# Patient Record
Sex: Female | Born: 1935 | Race: White | Hispanic: No | Marital: Married | Smoking: Never smoker
Health system: Southern US, Community
[De-identification: ages and names within clinical notes are randomized; demographics above are authoritative.]

## PROBLEM LIST (undated history)

## (undated) DIAGNOSIS — F419 Anxiety disorder, unspecified: Secondary | ICD-10-CM

## (undated) DIAGNOSIS — R030 Elevated blood-pressure reading, without diagnosis of hypertension: Secondary | ICD-10-CM

## (undated) DIAGNOSIS — H269 Unspecified cataract: Secondary | ICD-10-CM

## (undated) DIAGNOSIS — M1990 Unspecified osteoarthritis, unspecified site: Secondary | ICD-10-CM

## (undated) HISTORY — DX: Unspecified osteoarthritis, unspecified site: M19.90

## (undated) HISTORY — PX: OTHER SURGICAL HISTORY: SHX169

## (undated) HISTORY — DX: Unspecified cataract: H26.9

## (undated) HISTORY — DX: Anxiety disorder, unspecified: F41.9

## (undated) HISTORY — DX: Elevated blood-pressure reading, without diagnosis of hypertension: R03.0

---

## 1998-04-14 ENCOUNTER — Other Ambulatory Visit: Admission: RE | Admit: 1998-04-14 | Discharge: 1998-04-14 | Payer: Self-pay | Admitting: Obstetrics and Gynecology

## 1999-04-12 ENCOUNTER — Other Ambulatory Visit: Admission: RE | Admit: 1999-04-12 | Discharge: 1999-04-12 | Payer: Self-pay | Admitting: Obstetrics and Gynecology

## 1999-06-23 ENCOUNTER — Other Ambulatory Visit: Admission: RE | Admit: 1999-06-23 | Discharge: 1999-06-23 | Payer: Self-pay | Admitting: Gastroenterology

## 1999-06-23 ENCOUNTER — Encounter (INDEPENDENT_AMBULATORY_CARE_PROVIDER_SITE_OTHER): Payer: Self-pay | Admitting: Specialist

## 1999-08-03 ENCOUNTER — Encounter: Payer: Self-pay | Admitting: Cardiology

## 1999-08-03 ENCOUNTER — Inpatient Hospital Stay (HOSPITAL_COMMUNITY): Admission: EM | Admit: 1999-08-03 | Discharge: 1999-08-04 | Payer: Self-pay | Admitting: *Deleted

## 1999-08-04 ENCOUNTER — Encounter: Payer: Self-pay | Admitting: Cardiology

## 2000-04-05 ENCOUNTER — Other Ambulatory Visit: Admission: RE | Admit: 2000-04-05 | Discharge: 2000-04-05 | Payer: Self-pay | Admitting: Obstetrics and Gynecology

## 2001-05-07 ENCOUNTER — Other Ambulatory Visit: Admission: RE | Admit: 2001-05-07 | Discharge: 2001-05-07 | Payer: Self-pay | Admitting: Obstetrics and Gynecology

## 2002-05-22 ENCOUNTER — Other Ambulatory Visit: Admission: RE | Admit: 2002-05-22 | Discharge: 2002-05-22 | Payer: Self-pay | Admitting: Obstetrics and Gynecology

## 2004-03-25 ENCOUNTER — Ambulatory Visit (HOSPITAL_COMMUNITY): Admission: RE | Admit: 2004-03-25 | Discharge: 2004-03-25 | Payer: Self-pay | Admitting: Gastroenterology

## 2004-03-25 ENCOUNTER — Encounter (INDEPENDENT_AMBULATORY_CARE_PROVIDER_SITE_OTHER): Payer: Self-pay | Admitting: Specialist

## 2005-05-30 ENCOUNTER — Encounter: Admission: RE | Admit: 2005-05-30 | Discharge: 2005-05-30 | Payer: Self-pay | Admitting: Internal Medicine

## 2006-08-31 IMAGING — CR DG HAND COMPLETE 3+V*R*
4 series · 4 of 4 positions shown · non-contrast
Comparison: none

CLINICAL DATA: Slammed thumb in car door. 
 RIGHT HAND ? 3 VIEW:

[x hand pa right]
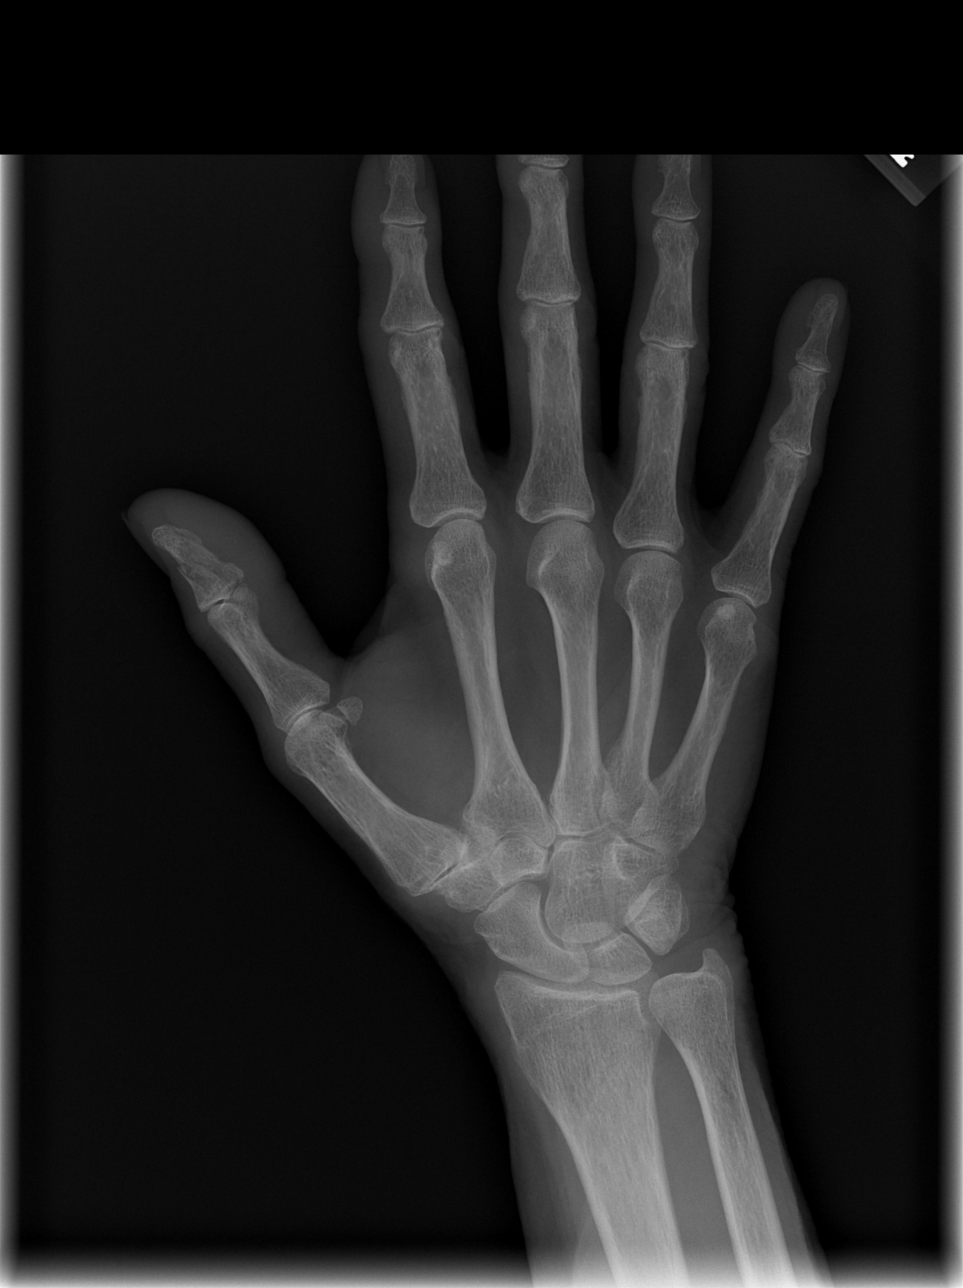

[x hand oblique right]
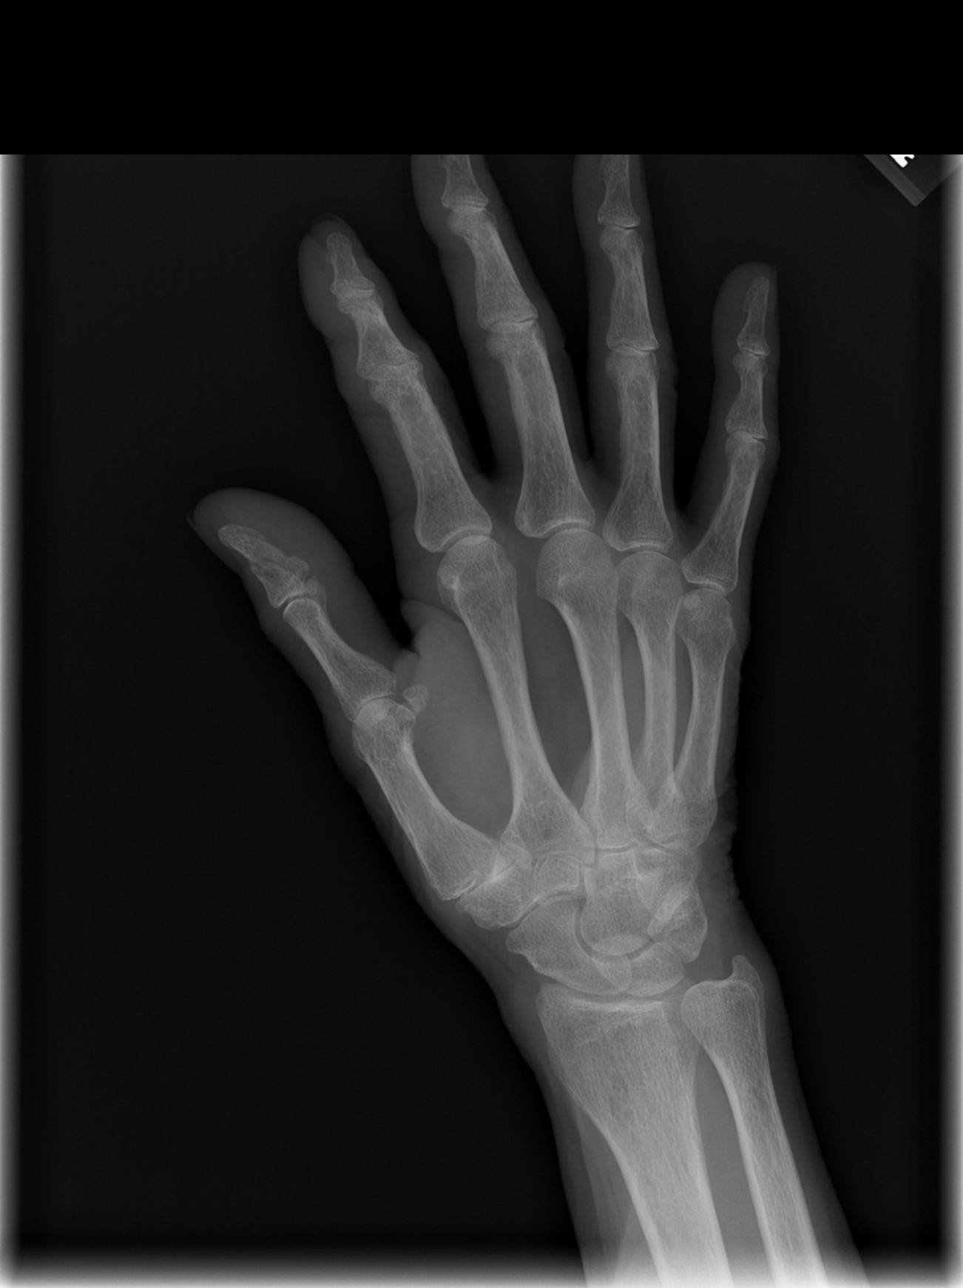

[x hand lat right (1 of 2)]
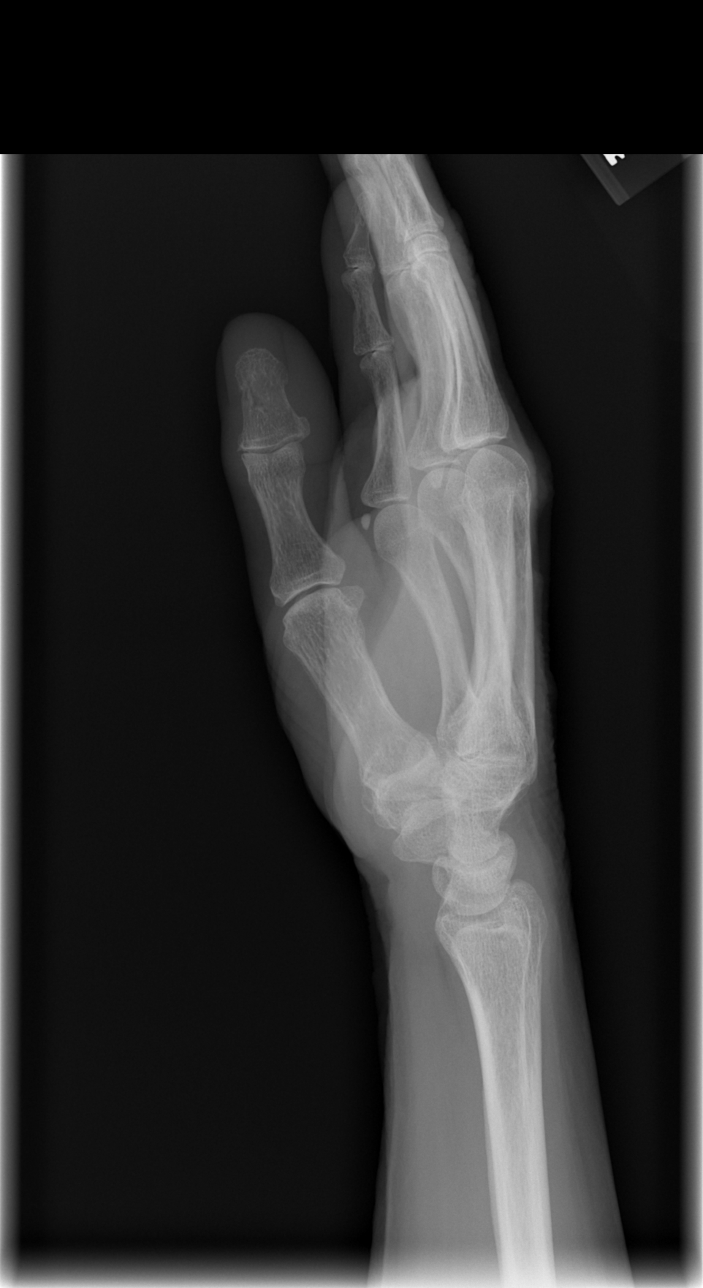

[x hand lat right (2 of 2)]
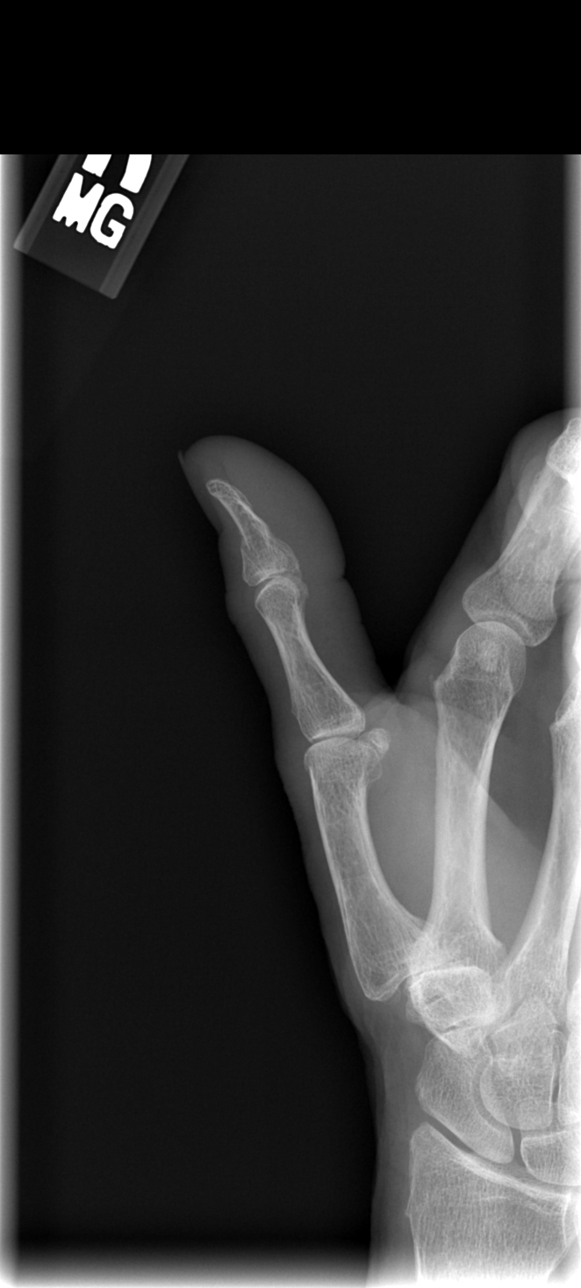

[4 of 4 positions shown; findings below may reference images not displayed]

FINDINGS: Three views of the right hand and a lateral view of the right thumb show a comminuted fracture of the distal phalanx of the right thumb with soft tissue swelling.  The fracture does appear to involve the DIP joint, with no displacement evident.  No other acute bony abnormality is seen.  There is degenerative change at the carpal-first metacarpal articulation.
IMPRESSION: Nondisplaced comminuted fracture of the distal phalanx of the right thumb.

## 2009-08-13 ENCOUNTER — Encounter: Admission: RE | Admit: 2009-08-13 | Discharge: 2009-08-13 | Payer: Self-pay | Admitting: Obstetrics and Gynecology

## 2010-11-14 IMAGING — CR DG THORACIC SPINE 3V
3 series · 3 of 3 positions shown · non-contrast
Comparison: None

CLINICAL DATA: Osteoporosis

THORACIC SPINE - 2 VIEW + SWIMMERS

[t t-spine a.p.]
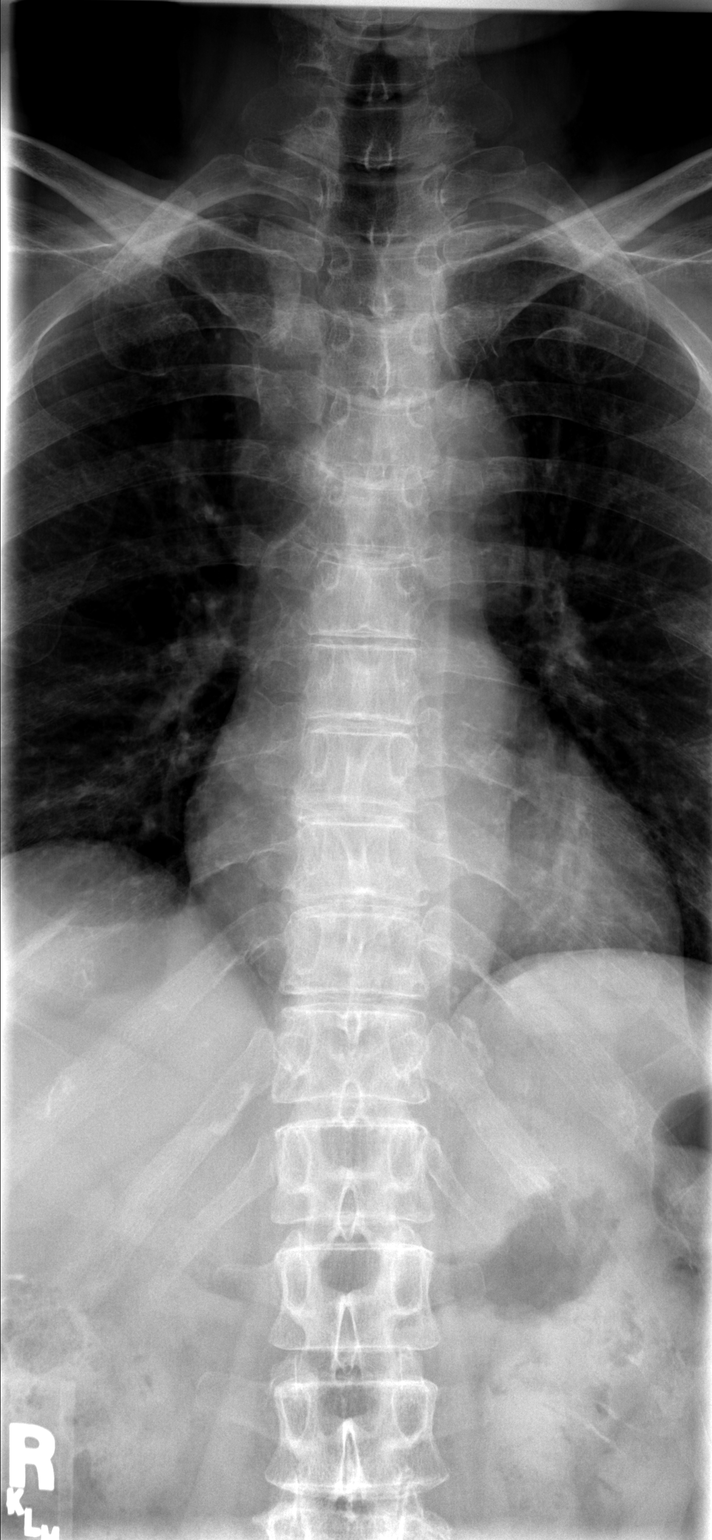

[t t-spine lat *]
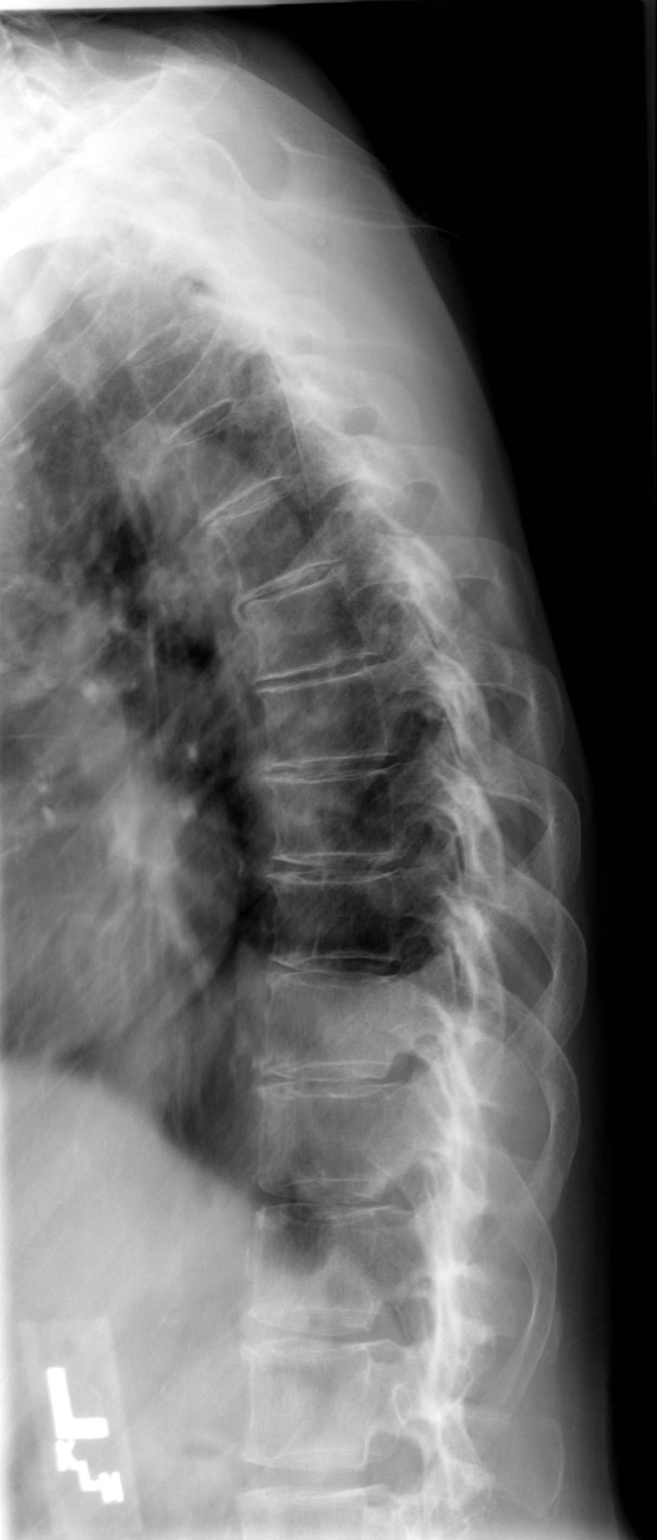

[t swimmers]
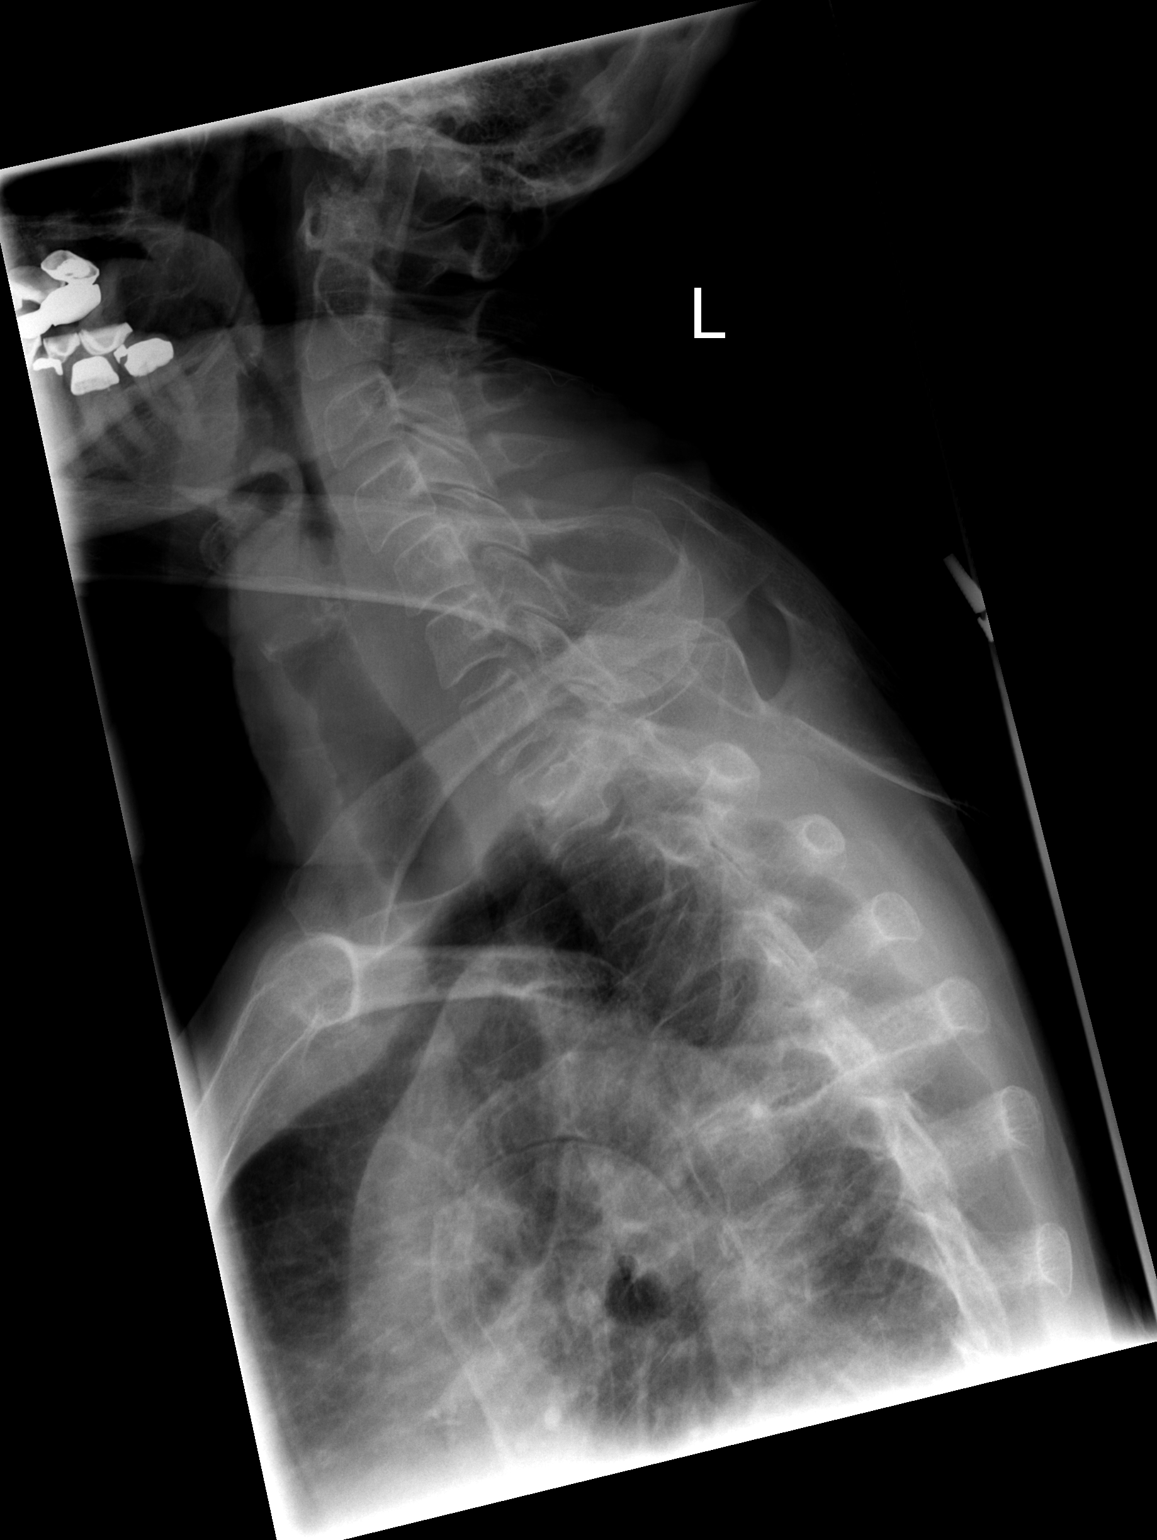

[3 of 3 positions shown; findings below may reference images not displayed]

FINDINGS: The thoracic vertebrae are in normal alignment.  The
bones do appear to be diffusely osteopenic.  No compression
deformity is seen.  Only mild degenerative change is noted.
IMPRESSION: Normal alignment with diffuse osteopenia.  No acute abnormality.

## 2011-01-28 NOTE — Op Note (Signed)
Erica Orr, Erica Orr                        ACCOUNT NO.:  [id number]   MEDICAL RECORD NO.:  [id number]                   PATIENT TYPE:  AMB   LOCATION:  ENDO                                 FACILITY:  [facility]   PHYSICIAN:  Petra Kuba, M.D.                 DATE OF BIRTH:  [birth year]   DATE OF PROCEDURE:  03/25/2004  DATE OF DISCHARGE:                                 OPERATIVE REPORT   PROCEDURE:  Esophagogastroduodenoscopy with biopsy.   INDICATIONS:  Chronic reflux, questionable history of Barrett's on previous  endoscopy.   Consent was signed after risks, benefits, methods, options thoroughly  discussed in the office.   Additional medicines for this procedure were Demerol 10 and Versed 1.   PROCEDURE:  The video endoscope was inserted by direct vision.  The  proximal, mid, and distal esophagus was normal.  There were no signs of any  Barrett's.  She did have a small hiatal hernia and some GE junction spasm.  The scope was passed into the stomach, where a few mid gastric erosions were  seen, and advanced through the antrum, pertinent for some minimal antritis,  through a normal pylorus, into a normal duodenal bulb, and around the C loop  to a normal second portion of the duodenum.  The scope was withdrawn back  into the bulb, and a good look there ruled out abnormalities in all  locations.  The scope was withdrawn back to the stomach and retroflexed.  High in the cardia, a hiatal hernia was confirmed.  The fundus, angularis,  lesser and greater curve were all normal except for the erosion seen on  insertion.  The scope was straightened, and straight visualization of the  stomach did not reveal any additional findings.  We took a few biopsies of  the erosions and put them in one container and a few biopsies of the distal  esophagus to make sure there were no microscopic changes compatible with the  Barrett's and put them in a second container.  No other abnormalities were  seen  as the scope was removed.  The patient tolerated the procedure well.  There was no obvious immediate complication.   ENDOSCOPIC DIAGNOSES:  1. Small hiatal hernia.  2. A few mid greater curve erosions, probably aspirin-induced, status post     biopsy.  3. Minimal antritis.  4. Otherwise normal esophagogastroduodenoscopy, status post distal     esophageal biopsies to rule out Barrett's.   PLAN:  Would use pump inhibitors p.r.n. and then long term if she ever has  an aspirin-induced GI bleed or complication.  Be happy to see back p.r.n.,  otherwise return care to Dr. Jarold Motto for further customary health-care  maintenance.  I will await pathology and be glad to help as needed.  Petra Kuba, M.D.    MEM/MEDQ  D:  03/25/2004  T:  03/25/2004  Job:  [id number]   cc:   Barry Dienes. Eloise Harman, M.D.  [address]  [place]  [place] [address]  Fax: [phone]

## 2011-01-28 NOTE — Op Note (Signed)
Erica Orr, Erica Orr                        ACCOUNT NO.:  [id number]   MEDICAL RECORD NO.:  [id number]                   PATIENT TYPE:  AMB   LOCATION:  ENDO                                 [facility]ACILITY:  [facility]   PHYSICIAN:  Petra Kuba, M.D.                 DATE OF BIRTH:  1930-07-20   DATE OF PROCEDURE:  03/25/2004  DATE OF DISCHARGE:                                 OPERATIVE REPORT   PROCEDURE:  Colonoscopy.   INDICATION:  Family history and personal history of colon polyps, due for  repeat screening.  Consent was signed after risks, benefits, methods,  options thoroughly discussed in the office.   MEDICINES USED:  1. Demerol 50.  2. Versed 5.   PROCEDURE:  Rectal inspection is pertinent for external hemorrhoids.  Small  digital exam is negative.  Video pediatric adjustable colonoscope was  inserted with some difficulty to a tortuous sigmoid.  Once past this area we  were easily able to be advanced around the colon to the cecum.  This did  require some abdominal pressure but no position changes.  No obvious  abnormality was seen on insertion.  The cecum was identified by the  appendiceal orifice and the ileocecal valve.  Prep was adequate.  There was  some liquid stool that required washing and suctioning.  The scope was  slowly withdrawn.  The cecum and the ascending were normal. In the proximal  level of the hepatic flexure a tiny polyp was seen and was hot biopsied x1.  The scope was further withdrawn, no other polypoid lesions were seen as we  slowly withdrew back to the rectum.  There was a rare left-sided diverticula  but no other abnormalities.  Anorectal pull through and retroflexion  confirmed some small hemorrhoids.  The scope was straightened and readvanced  a short ways up the left side of the colon.  Air was suctioned, the scope  removed.  The patient tolerated the procedure well.  There was no obvious  immediate complications.   ENDOSCOPIC DIAGNOSES:  1.  Internal, external hemorrhoids.  2. Tortuous sigmoid.  3. Rare left-sided diverticula.  4. Tiny hepatic flexure polyp hot biopsied.  5. Otherwise within normal limits to the cecum.   PLAN:  Await pathology, but probably recheck colon screening in 5 years if  doing well medically.  Might even want to do a virtual colonoscopy based on  tortuosity at that junction if widely available and continue workup with a  one-time EGD.                                               Petra Kuba, M.D.    MEM/MEDQ  D:  03/25/2004  T:  03/25/2004  Job:  [id number]   cc:   Brunilda Payor,  M.D. 

## 2011-01-28 NOTE — Discharge Summary (Signed)
[facility]  Patient:    Erica Orr                      MRN: [id number] Adm. Date:  [id number] Disc. Date: 08/04/99 Attending:  Rollene Rotunda Dictator:   Leonides Cave, P.A. CC:         Rollene Rotunda, M.D. [facility]             Vania Rea. Jarold Motto, M.D. [facility]                  Referring Physician Discharge [facility]  DATE OF BIRTH:  12/23/1929  DISCHARGE DIAGNOSES: 1. Atypical left arm pain, prolonged. 2. Recent hypertension, probably situational. 3. History of medical noncompliance.  BRIEF HISTORY:  The patient is a 77 year old white female who transferred from [facility] secondary to increased blood pressure and arm pain. On Saturday prior to admission she awoke at 7:30 in the morning and noticed that her left upper arm felt heavy which lasted about 30 minutes.  This recurred on Sunday morning and lasted four to five hours.  It waxed and waned and was not helped with heat, cold, muscle cream, or activity.  The patient went to be evaluated at Urgent Care on August 03, 1999, and reportedly her blood pressure was 192/123, pulse  126, and she was given two sublingual nitroglycerin which provided very slight relief.  She was transferred to [facility] Emergency Department from Urgent Care. The patient was admitted to rule out MI and was setup for a Cardiolite.  HOSPITAL COURSE:  The patient had a stress Cardiolite on August 04, 1999. Predicted maximum heart rate was 153, heart rate reach was 150.  The patient tolerated the test very well.  There was no change in her upper extremity pain.  Cardiolite results revealed no ischemia, normal Cardiolite, ejection fraction 73%. The patient is therefore discharged home in stable condition and to follow up with her primary care physician concerning her arm pain.  DISCHARGE MEDICATIONS:  The patient will be sent home on the same medications she came in on.  They include, ______  40 mg q.d. and coated aspirin 325 mg q.d.  ACTIVITY:  Unrestricted.  DIET:  Low fat diet was recommended.  FOLLOWUP:  The patient was told to follow up with Dr. Antoine Poche on a p.r.n. basis and Dr. Jarold Motto as needed.  She is to follow up and/or obtain a primary care physician to manage her blood pressures.  Her blood pressures were a little on he high side during the hospitalization and at some point she may need to be put on antihypertensive medications.  The patients CK, CK-MBs and troponin I was negative x 3.  Total cholesterol was  229, triglycerides 58, HDL 85 and LDL 135. DD:  08/04/99 TD:  08/04/99 Job: [id number]

## 2011-09-27 DIAGNOSIS — H40059 Ocular hypertension, unspecified eye: Secondary | ICD-10-CM | POA: Diagnosis not present

## 2011-10-13 ENCOUNTER — Other Ambulatory Visit: Payer: Self-pay | Admitting: Dermatology

## 2011-10-13 DIAGNOSIS — Z85828 Personal history of other malignant neoplasm of skin: Secondary | ICD-10-CM | POA: Diagnosis not present

## 2011-10-13 DIAGNOSIS — D0439 Carcinoma in situ of skin of other parts of face: Secondary | ICD-10-CM | POA: Diagnosis not present

## 2011-10-13 DIAGNOSIS — D216 Benign neoplasm of connective and other soft tissue of trunk, unspecified: Secondary | ICD-10-CM | POA: Diagnosis not present

## 2011-10-13 DIAGNOSIS — L821 Other seborrheic keratosis: Secondary | ICD-10-CM | POA: Diagnosis not present

## 2011-10-13 DIAGNOSIS — C44320 Squamous cell carcinoma of skin of unspecified parts of face: Secondary | ICD-10-CM | POA: Diagnosis not present

## 2011-10-13 DIAGNOSIS — D235 Other benign neoplasm of skin of trunk: Secondary | ICD-10-CM | POA: Diagnosis not present

## 2012-01-05 DIAGNOSIS — C44320 Squamous cell carcinoma of skin of unspecified parts of face: Secondary | ICD-10-CM | POA: Diagnosis not present

## 2012-01-09 DIAGNOSIS — D280 Benign neoplasm of vulva: Secondary | ICD-10-CM | POA: Diagnosis not present

## 2012-01-09 DIAGNOSIS — D4959 Neoplasm of unspecified behavior of other genitourinary organ: Secondary | ICD-10-CM | POA: Diagnosis not present

## 2012-02-13 DIAGNOSIS — G44209 Tension-type headache, unspecified, not intractable: Secondary | ICD-10-CM | POA: Diagnosis not present

## 2012-02-13 DIAGNOSIS — H52229 Regular astigmatism, unspecified eye: Secondary | ICD-10-CM | POA: Diagnosis not present

## 2012-02-13 DIAGNOSIS — G43009 Migraine without aura, not intractable, without status migrainosus: Secondary | ICD-10-CM | POA: Diagnosis not present

## 2012-02-13 DIAGNOSIS — H5200 Hypermetropia, unspecified eye: Secondary | ICD-10-CM | POA: Diagnosis not present

## 2012-02-16 DIAGNOSIS — L819 Disorder of pigmentation, unspecified: Secondary | ICD-10-CM | POA: Diagnosis not present

## 2012-02-16 DIAGNOSIS — L570 Actinic keratosis: Secondary | ICD-10-CM | POA: Diagnosis not present

## 2012-02-16 DIAGNOSIS — D1801 Hemangioma of skin and subcutaneous tissue: Secondary | ICD-10-CM | POA: Diagnosis not present

## 2012-02-16 DIAGNOSIS — L821 Other seborrheic keratosis: Secondary | ICD-10-CM | POA: Diagnosis not present

## 2012-04-14 ENCOUNTER — Emergency Department (HOSPITAL_COMMUNITY): Payer: Medicare Other

## 2012-04-14 ENCOUNTER — Emergency Department (HOSPITAL_COMMUNITY)
Admission: EM | Admit: 2012-04-14 | Discharge: 2012-04-14 | Disposition: A | Discharge status: Home / Self Care | Payer: Medicare Other | Attending: Emergency Medicine | Admitting: Emergency Medicine

## 2012-04-14 ENCOUNTER — Encounter (HOSPITAL_COMMUNITY): Payer: Self-pay | Admitting: Emergency Medicine

## 2012-04-14 DIAGNOSIS — M25559 Pain in unspecified hip: Secondary | ICD-10-CM | POA: Diagnosis not present

## 2012-04-14 DIAGNOSIS — M5430 Sciatica, unspecified side: Secondary | ICD-10-CM | POA: Insufficient documentation

## 2012-04-14 DIAGNOSIS — M4120 Other idiopathic scoliosis, site unspecified: Secondary | ICD-10-CM | POA: Diagnosis not present

## 2012-04-14 DIAGNOSIS — M549 Dorsalgia, unspecified: Secondary | ICD-10-CM | POA: Diagnosis not present

## 2012-04-14 MED ORDER — IBUPROFEN 200 MG PO TABS
600.0000 mg | ORAL_TABLET | Freq: Once | ORAL | Status: AC
  Administered 2012-04-14: 600 mg via ORAL
  Filled 2012-04-14: qty 3

## 2012-04-14 MED ORDER — IBUPROFEN 600 MG PO TABS: 600.0000 mg | ORAL_TABLET | Freq: Four times a day (QID) | ORAL | Status: AC | PRN

## 2012-04-14 MED ORDER — HYDROCODONE-ACETAMINOPHEN 5-325 MG PO TABS: 2.0000 | ORAL_TABLET | ORAL | Status: AC | PRN

## 2012-04-14 NOTE — ED Notes (Signed)
Pt c/o right hip pain that radiates to back that started 2200 last night in a jacuzzi. Pt reports waking up in severe pain at 0300. Denies fall. No shortening on inspection.

## 2012-04-14 NOTE — ED Provider Notes (Signed)
History     CSN: [id number]  Arrival date & time 04/14/12  0650   First MD Initiated Contact with Patient 04/14/12 [phone]      No chief complaint on file.    HPI Pt c/o right hip pain that radiates to back that started 2200 last night in a jacuzzi. Pt reports waking up in severe pain at 0300. Denies fall. No shortening on inspection.  Patient denies rash.  History reviewed. No pertinent past medical history.  Past Surgical History  Procedure Date  . Knee sx     for torn meniscus    No family history on file.  History  Substance Use Topics  . Smoking status: Never Smoker   . Smokeless tobacco: Never Used  . Alcohol Use: 0.6 oz/week    1 Glasses of wine per week    OB History    Grav Para Term Preterm Abortions TAB SAB Ect Mult Living                  Review of Systems  All other systems reviewed and are negative.    Allergies  Review of patient's allergies indicates no known allergies.  Home Medications   Current Outpatient Rx  Name Route Sig Dispense Refill  . ACETAMINOPHEN 500 MG PO TABS Oral Take 500-1,000 mg by mouth every 6 (six) hours as needed. For pain.    Marland Kitchen CALCIUM CARBONATE 600 MG PO TABS Oral Take 600 mg by mouth daily.    Marland Kitchen VITAMIN D 1000 UNITS PO TABS Oral Take 1,000 Units by mouth daily.    Marland Kitchen GLUCOSAMINE-CHONDROITIN 500-400 MG PO TABS Oral Take 1 tablet by mouth daily.    Marland Kitchen HYDROCODONE-ACETAMINOPHEN 5-325 MG PO TABS Oral Take 2 tablets by mouth every 4 (four) hours as needed for pain. 15 tablet 0  . IBUPROFEN 600 MG PO TABS Oral Take 1 tablet (600 mg total) by mouth every 6 (six) hours as needed for pain. 30 tablet 0    BP 165/80  Pulse 90  Temp 97.5 F (36.4 C) (Oral)  Resp 18  SpO2 96%  Physical Exam  Nursing note and vitals reviewed. Constitutional: She is oriented to person, place, and time. She appears well-developed. No distress.  HENT:  Head: Normocephalic and atraumatic.  Eyes: Pupils are equal, round, and reactive to light.    Neck: Normal range of motion.  Cardiovascular: Normal rate and intact distal pulses.   Pulmonary/Chest: No respiratory distress.  Abdominal: Normal appearance. She exhibits no distension.  Musculoskeletal:       Right hip: She exhibits decreased range of motion. She exhibits no bony tenderness, no swelling, no crepitus and no deformity.       Back:  Neurological: She is alert and oriented to person, place, and time. No cranial nerve deficit.  Skin: Skin is warm and dry. No rash noted.  Psychiatric: She has a normal mood and affect. Her behavior is normal.    ED Course  Procedures (including critical care time)  Labs Reviewed - No data to display Dg Lumbar Spine Complete  04/14/2012  *RADIOLOGY REPORT*  Clinical Data: Fall, right back pain  LUMBAR SPINE - COMPLETE 4+ VIEW  Comparison: 08/13/2009  Findings: Five lumbar-type vertebral bodies.  Mild lumbar levoscoliosis.  No evidence of fracture or dislocation.  The vertebral body heights are maintained.  Mild multilevel degenerative changes, most prominent at L2-3.  Vascular calcifications.  IMPRESSION: No fracture or dislocation is seen.  Mild multilevel degenerative changes with  levoscoliosis.  Original Report Authenticated By: Charline Bills, M.D.   Dg Hip Complete Right  04/14/2012  *RADIOLOGY REPORT*  Clinical Data: Fall, right back/hip pain  RIGHT HIP - COMPLETE 2+ VIEW  Comparison: None.  Findings: No fracture or dislocation is seen.  The bilateral hip joint spaces are symmetric.  The visualized bony pelvis appears intact.  Mild degenerative changes of the lower lumbar spine.  IMPRESSION: No fracture or dislocation is seen.  Original Report Authenticated By: Charline Bills, M.D.     1. Sciatica       MDM         Nelia Shi, MD 04/14/12 [phone]

## 2012-04-17 DIAGNOSIS — M545 Low back pain: Secondary | ICD-10-CM | POA: Diagnosis not present

## 2012-04-24 DIAGNOSIS — M545 Low back pain: Secondary | ICD-10-CM | POA: Diagnosis not present

## 2012-05-07 DIAGNOSIS — M545 Low back pain: Secondary | ICD-10-CM | POA: Diagnosis not present

## 2012-05-15 DIAGNOSIS — E785 Hyperlipidemia, unspecified: Secondary | ICD-10-CM | POA: Diagnosis not present

## 2012-05-15 DIAGNOSIS — R7309 Other abnormal glucose: Secondary | ICD-10-CM | POA: Diagnosis not present

## 2012-05-15 DIAGNOSIS — R82998 Other abnormal findings in urine: Secondary | ICD-10-CM | POA: Diagnosis not present

## 2012-05-18 DIAGNOSIS — M545 Low back pain: Secondary | ICD-10-CM | POA: Diagnosis not present

## 2012-05-21 DIAGNOSIS — Z1212 Encounter for screening for malignant neoplasm of rectum: Secondary | ICD-10-CM | POA: Diagnosis not present

## 2012-05-21 DIAGNOSIS — Z23 Encounter for immunization: Secondary | ICD-10-CM | POA: Diagnosis not present

## 2012-05-21 DIAGNOSIS — E785 Hyperlipidemia, unspecified: Secondary | ICD-10-CM | POA: Diagnosis not present

## 2012-05-21 DIAGNOSIS — Z0000 Encounter for general adult medical examination without abnormal findings: Secondary | ICD-10-CM | POA: Diagnosis not present

## 2012-05-21 DIAGNOSIS — R030 Elevated blood-pressure reading, without diagnosis of hypertension: Secondary | ICD-10-CM | POA: Diagnosis not present

## 2012-05-21 DIAGNOSIS — K449 Diaphragmatic hernia without obstruction or gangrene: Secondary | ICD-10-CM | POA: Diagnosis not present

## 2012-08-14 DIAGNOSIS — Z1231 Encounter for screening mammogram for malignant neoplasm of breast: Secondary | ICD-10-CM | POA: Diagnosis not present

## 2012-08-30 DIAGNOSIS — R030 Elevated blood-pressure reading, without diagnosis of hypertension: Secondary | ICD-10-CM | POA: Diagnosis not present

## 2012-08-30 DIAGNOSIS — R05 Cough: Secondary | ICD-10-CM | POA: Diagnosis not present

## 2012-08-30 DIAGNOSIS — R509 Fever, unspecified: Secondary | ICD-10-CM | POA: Diagnosis not present

## 2012-09-26 DIAGNOSIS — H40059 Ocular hypertension, unspecified eye: Secondary | ICD-10-CM | POA: Diagnosis not present

## 2012-10-23 DIAGNOSIS — H409 Unspecified glaucoma: Secondary | ICD-10-CM | POA: Diagnosis not present

## 2012-10-23 DIAGNOSIS — H4011X0 Primary open-angle glaucoma, stage unspecified: Secondary | ICD-10-CM | POA: Diagnosis not present

## 2013-02-14 DIAGNOSIS — L821 Other seborrheic keratosis: Secondary | ICD-10-CM | POA: Diagnosis not present

## 2013-02-14 DIAGNOSIS — L919 Hypertrophic disorder of the skin, unspecified: Secondary | ICD-10-CM | POA: Diagnosis not present

## 2013-02-14 DIAGNOSIS — D239 Other benign neoplasm of skin, unspecified: Secondary | ICD-10-CM | POA: Diagnosis not present

## 2013-02-14 DIAGNOSIS — D1801 Hemangioma of skin and subcutaneous tissue: Secondary | ICD-10-CM | POA: Diagnosis not present

## 2013-02-14 DIAGNOSIS — L909 Atrophic disorder of skin, unspecified: Secondary | ICD-10-CM | POA: Diagnosis not present

## 2013-02-14 DIAGNOSIS — L820 Inflamed seborrheic keratosis: Secondary | ICD-10-CM | POA: Diagnosis not present

## 2013-02-14 DIAGNOSIS — L819 Disorder of pigmentation, unspecified: Secondary | ICD-10-CM | POA: Diagnosis not present

## 2013-04-23 DIAGNOSIS — H409 Unspecified glaucoma: Secondary | ICD-10-CM | POA: Diagnosis not present

## 2013-04-23 DIAGNOSIS — H4011X0 Primary open-angle glaucoma, stage unspecified: Secondary | ICD-10-CM | POA: Diagnosis not present

## 2013-05-29 DIAGNOSIS — Z1289 Encounter for screening for malignant neoplasm of other sites: Secondary | ICD-10-CM | POA: Diagnosis not present

## 2013-07-12 DIAGNOSIS — R030 Elevated blood-pressure reading, without diagnosis of hypertension: Secondary | ICD-10-CM | POA: Diagnosis not present

## 2013-07-12 DIAGNOSIS — E785 Hyperlipidemia, unspecified: Secondary | ICD-10-CM | POA: Diagnosis not present

## 2013-07-12 DIAGNOSIS — R7309 Other abnormal glucose: Secondary | ICD-10-CM | POA: Diagnosis not present

## 2013-07-12 DIAGNOSIS — R809 Proteinuria, unspecified: Secondary | ICD-10-CM | POA: Diagnosis not present

## 2013-07-22 DIAGNOSIS — R030 Elevated blood-pressure reading, without diagnosis of hypertension: Secondary | ICD-10-CM | POA: Diagnosis not present

## 2013-07-22 DIAGNOSIS — R7301 Impaired fasting glucose: Secondary | ICD-10-CM | POA: Diagnosis not present

## 2013-07-22 DIAGNOSIS — Z0000 Encounter for general adult medical examination without abnormal findings: Secondary | ICD-10-CM | POA: Diagnosis not present

## 2013-07-22 DIAGNOSIS — Z1331 Encounter for screening for depression: Secondary | ICD-10-CM | POA: Diagnosis not present

## 2013-07-22 DIAGNOSIS — IMO0002 Reserved for concepts with insufficient information to code with codable children: Secondary | ICD-10-CM | POA: Diagnosis not present

## 2013-07-22 DIAGNOSIS — E785 Hyperlipidemia, unspecified: Secondary | ICD-10-CM | POA: Diagnosis not present

## 2013-07-22 DIAGNOSIS — Z23 Encounter for immunization: Secondary | ICD-10-CM | POA: Diagnosis not present

## 2013-07-22 DIAGNOSIS — K449 Diaphragmatic hernia without obstruction or gangrene: Secondary | ICD-10-CM | POA: Diagnosis not present

## 2013-07-25 DIAGNOSIS — Z1212 Encounter for screening for malignant neoplasm of rectum: Secondary | ICD-10-CM | POA: Diagnosis not present

## 2013-07-30 DIAGNOSIS — R350 Frequency of micturition: Secondary | ICD-10-CM | POA: Diagnosis not present

## 2013-07-30 DIAGNOSIS — R82998 Other abnormal findings in urine: Secondary | ICD-10-CM | POA: Diagnosis not present

## 2013-07-30 DIAGNOSIS — IMO0002 Reserved for concepts with insufficient information to code with codable children: Secondary | ICD-10-CM | POA: Diagnosis not present

## 2013-07-30 DIAGNOSIS — N3000 Acute cystitis without hematuria: Secondary | ICD-10-CM | POA: Diagnosis not present

## 2013-08-16 DIAGNOSIS — Z1231 Encounter for screening mammogram for malignant neoplasm of breast: Secondary | ICD-10-CM | POA: Diagnosis not present

## 2013-10-22 DIAGNOSIS — H5200 Hypermetropia, unspecified eye: Secondary | ICD-10-CM | POA: Diagnosis not present

## 2013-10-22 DIAGNOSIS — H524 Presbyopia: Secondary | ICD-10-CM | POA: Diagnosis not present

## 2013-10-22 DIAGNOSIS — H52229 Regular astigmatism, unspecified eye: Secondary | ICD-10-CM | POA: Diagnosis not present

## 2013-10-22 DIAGNOSIS — H269 Unspecified cataract: Secondary | ICD-10-CM | POA: Diagnosis not present

## 2014-03-12 DIAGNOSIS — D1801 Hemangioma of skin and subcutaneous tissue: Secondary | ICD-10-CM | POA: Diagnosis not present

## 2014-03-12 DIAGNOSIS — D2370 Other benign neoplasm of skin of unspecified lower limb, including hip: Secondary | ICD-10-CM | POA: Diagnosis not present

## 2014-03-12 DIAGNOSIS — D239 Other benign neoplasm of skin, unspecified: Secondary | ICD-10-CM | POA: Diagnosis not present

## 2014-03-12 DIAGNOSIS — L821 Other seborrheic keratosis: Secondary | ICD-10-CM | POA: Diagnosis not present

## 2014-03-12 DIAGNOSIS — L723 Sebaceous cyst: Secondary | ICD-10-CM | POA: Diagnosis not present

## 2014-03-12 DIAGNOSIS — L819 Disorder of pigmentation, unspecified: Secondary | ICD-10-CM | POA: Diagnosis not present

## 2014-03-12 DIAGNOSIS — L739 Follicular disorder, unspecified: Secondary | ICD-10-CM | POA: Diagnosis not present

## 2014-03-12 DIAGNOSIS — L570 Actinic keratosis: Secondary | ICD-10-CM | POA: Diagnosis not present

## 2014-03-18 DIAGNOSIS — H18419 Arcus senilis, unspecified eye: Secondary | ICD-10-CM | POA: Diagnosis not present

## 2014-03-18 DIAGNOSIS — H25019 Cortical age-related cataract, unspecified eye: Secondary | ICD-10-CM | POA: Diagnosis not present

## 2014-03-18 DIAGNOSIS — H2510 Age-related nuclear cataract, unspecified eye: Secondary | ICD-10-CM | POA: Diagnosis not present

## 2014-03-18 DIAGNOSIS — H02839 Dermatochalasis of unspecified eye, unspecified eyelid: Secondary | ICD-10-CM | POA: Diagnosis not present

## 2014-04-07 DIAGNOSIS — H2510 Age-related nuclear cataract, unspecified eye: Secondary | ICD-10-CM | POA: Diagnosis not present

## 2014-04-07 DIAGNOSIS — H269 Unspecified cataract: Secondary | ICD-10-CM | POA: Diagnosis not present

## 2014-04-08 DIAGNOSIS — H2510 Age-related nuclear cataract, unspecified eye: Secondary | ICD-10-CM | POA: Diagnosis not present

## 2014-04-25 DIAGNOSIS — H2510 Age-related nuclear cataract, unspecified eye: Secondary | ICD-10-CM | POA: Diagnosis not present

## 2014-04-25 DIAGNOSIS — H269 Unspecified cataract: Secondary | ICD-10-CM | POA: Diagnosis not present

## 2014-07-28 DIAGNOSIS — R030 Elevated blood-pressure reading, without diagnosis of hypertension: Secondary | ICD-10-CM | POA: Diagnosis not present

## 2014-07-28 DIAGNOSIS — R7301 Impaired fasting glucose: Secondary | ICD-10-CM | POA: Diagnosis not present

## 2014-07-28 DIAGNOSIS — E785 Hyperlipidemia, unspecified: Secondary | ICD-10-CM | POA: Diagnosis not present

## 2014-07-28 DIAGNOSIS — R739 Hyperglycemia, unspecified: Secondary | ICD-10-CM | POA: Diagnosis not present

## 2014-07-28 DIAGNOSIS — Z008 Encounter for other general examination: Secondary | ICD-10-CM | POA: Diagnosis not present

## 2014-08-04 ENCOUNTER — Other Ambulatory Visit: Payer: Self-pay | Admitting: Internal Medicine

## 2014-08-04 DIAGNOSIS — Z23 Encounter for immunization: Secondary | ICD-10-CM | POA: Diagnosis not present

## 2014-08-04 DIAGNOSIS — R1310 Dysphagia, unspecified: Secondary | ICD-10-CM | POA: Diagnosis not present

## 2014-08-04 DIAGNOSIS — R030 Elevated blood-pressure reading, without diagnosis of hypertension: Secondary | ICD-10-CM | POA: Diagnosis not present

## 2014-08-04 DIAGNOSIS — R739 Hyperglycemia, unspecified: Secondary | ICD-10-CM | POA: Diagnosis not present

## 2014-08-04 DIAGNOSIS — Z0000 Encounter for general adult medical examination without abnormal findings: Secondary | ICD-10-CM | POA: Diagnosis not present

## 2014-08-04 DIAGNOSIS — F419 Anxiety disorder, unspecified: Secondary | ICD-10-CM | POA: Diagnosis not present

## 2014-08-04 DIAGNOSIS — R7301 Impaired fasting glucose: Secondary | ICD-10-CM | POA: Diagnosis not present

## 2014-08-04 DIAGNOSIS — E785 Hyperlipidemia, unspecified: Secondary | ICD-10-CM | POA: Diagnosis not present

## 2014-08-04 DIAGNOSIS — R4702 Dysphasia: Secondary | ICD-10-CM

## 2014-08-04 DIAGNOSIS — Z1389 Encounter for screening for other disorder: Secondary | ICD-10-CM | POA: Diagnosis not present

## 2014-08-04 DIAGNOSIS — R8299 Other abnormal findings in urine: Secondary | ICD-10-CM | POA: Diagnosis not present

## 2014-08-05 ENCOUNTER — Ambulatory Visit
Admission: RE | Admit: 2014-08-05 | Discharge: 2014-08-05 | Disposition: A | Discharge status: Home / Self Care | Payer: Medicare Other | Source: Ambulatory Visit | Attending: Internal Medicine | Admitting: Internal Medicine

## 2014-08-05 DIAGNOSIS — K449 Diaphragmatic hernia without obstruction or gangrene: Secondary | ICD-10-CM | POA: Diagnosis not present

## 2014-08-05 DIAGNOSIS — K219 Gastro-esophageal reflux disease without esophagitis: Secondary | ICD-10-CM | POA: Diagnosis not present

## 2014-08-05 DIAGNOSIS — R4702 Dysphasia: Secondary | ICD-10-CM

## 2014-08-17 DIAGNOSIS — N390 Urinary tract infection, site not specified: Secondary | ICD-10-CM | POA: Diagnosis not present

## 2014-08-17 DIAGNOSIS — R5383 Other fatigue: Secondary | ICD-10-CM | POA: Diagnosis not present

## 2014-08-17 DIAGNOSIS — R300 Dysuria: Secondary | ICD-10-CM | POA: Diagnosis not present

## 2014-08-22 DIAGNOSIS — Z1212 Encounter for screening for malignant neoplasm of rectum: Secondary | ICD-10-CM | POA: Diagnosis not present

## 2014-08-23 DIAGNOSIS — N3090 Cystitis, unspecified without hematuria: Secondary | ICD-10-CM | POA: Diagnosis not present

## 2014-10-13 DIAGNOSIS — Z1231 Encounter for screening mammogram for malignant neoplasm of breast: Secondary | ICD-10-CM | POA: Diagnosis not present

## 2014-10-16 DIAGNOSIS — R922 Inconclusive mammogram: Secondary | ICD-10-CM | POA: Diagnosis not present

## 2014-10-16 DIAGNOSIS — R928 Other abnormal and inconclusive findings on diagnostic imaging of breast: Secondary | ICD-10-CM | POA: Diagnosis not present

## 2014-11-24 DIAGNOSIS — K649 Unspecified hemorrhoids: Secondary | ICD-10-CM | POA: Diagnosis not present

## 2015-03-13 DIAGNOSIS — L570 Actinic keratosis: Secondary | ICD-10-CM | POA: Diagnosis not present

## 2015-03-13 DIAGNOSIS — L720 Epidermal cyst: Secondary | ICD-10-CM | POA: Diagnosis not present

## 2015-03-13 DIAGNOSIS — D225 Melanocytic nevi of trunk: Secondary | ICD-10-CM | POA: Diagnosis not present

## 2015-03-13 DIAGNOSIS — L738 Other specified follicular disorders: Secondary | ICD-10-CM | POA: Diagnosis not present

## 2015-03-13 DIAGNOSIS — D2272 Melanocytic nevi of left lower limb, including hip: Secondary | ICD-10-CM | POA: Diagnosis not present

## 2015-03-13 DIAGNOSIS — L821 Other seborrheic keratosis: Secondary | ICD-10-CM | POA: Diagnosis not present

## 2015-03-13 DIAGNOSIS — L723 Sebaceous cyst: Secondary | ICD-10-CM | POA: Diagnosis not present

## 2015-03-13 DIAGNOSIS — B078 Other viral warts: Secondary | ICD-10-CM | POA: Diagnosis not present

## 2015-03-13 DIAGNOSIS — L814 Other melanin hyperpigmentation: Secondary | ICD-10-CM | POA: Diagnosis not present

## 2015-03-13 DIAGNOSIS — D2271 Melanocytic nevi of right lower limb, including hip: Secondary | ICD-10-CM | POA: Diagnosis not present

## 2015-03-13 DIAGNOSIS — D1801 Hemangioma of skin and subcutaneous tissue: Secondary | ICD-10-CM | POA: Diagnosis not present

## 2015-04-28 DIAGNOSIS — H5203 Hypermetropia, bilateral: Secondary | ICD-10-CM | POA: Diagnosis not present

## 2015-04-28 DIAGNOSIS — H26499 Other secondary cataract, unspecified eye: Secondary | ICD-10-CM | POA: Diagnosis not present

## 2015-04-28 DIAGNOSIS — H52223 Regular astigmatism, bilateral: Secondary | ICD-10-CM | POA: Diagnosis not present

## 2015-04-28 DIAGNOSIS — Z961 Presence of intraocular lens: Secondary | ICD-10-CM | POA: Diagnosis not present

## 2015-04-29 DIAGNOSIS — E559 Vitamin D deficiency, unspecified: Secondary | ICD-10-CM | POA: Diagnosis not present

## 2015-04-29 DIAGNOSIS — N63 Unspecified lump in breast: Secondary | ICD-10-CM | POA: Diagnosis not present

## 2015-08-05 DIAGNOSIS — R7301 Impaired fasting glucose: Secondary | ICD-10-CM | POA: Diagnosis not present

## 2015-08-05 DIAGNOSIS — N390 Urinary tract infection, site not specified: Secondary | ICD-10-CM | POA: Diagnosis not present

## 2015-08-05 DIAGNOSIS — E785 Hyperlipidemia, unspecified: Secondary | ICD-10-CM | POA: Diagnosis not present

## 2015-08-05 DIAGNOSIS — R8299 Other abnormal findings in urine: Secondary | ICD-10-CM | POA: Diagnosis not present

## 2015-08-12 DIAGNOSIS — R1310 Dysphagia, unspecified: Secondary | ICD-10-CM | POA: Diagnosis not present

## 2015-08-12 DIAGNOSIS — Z1389 Encounter for screening for other disorder: Secondary | ICD-10-CM | POA: Diagnosis not present

## 2015-08-12 DIAGNOSIS — K580 Irritable bowel syndrome with diarrhea: Secondary | ICD-10-CM | POA: Diagnosis not present

## 2015-08-12 DIAGNOSIS — M810 Age-related osteoporosis without current pathological fracture: Secondary | ICD-10-CM | POA: Diagnosis not present

## 2015-08-12 DIAGNOSIS — Z6824 Body mass index (BMI) 24.0-24.9, adult: Secondary | ICD-10-CM | POA: Diagnosis not present

## 2015-08-12 DIAGNOSIS — Z0000 Encounter for general adult medical examination without abnormal findings: Secondary | ICD-10-CM | POA: Diagnosis not present

## 2015-08-12 DIAGNOSIS — R7301 Impaired fasting glucose: Secondary | ICD-10-CM | POA: Diagnosis not present

## 2015-08-12 DIAGNOSIS — E785 Hyperlipidemia, unspecified: Secondary | ICD-10-CM | POA: Diagnosis not present

## 2015-08-12 DIAGNOSIS — R030 Elevated blood-pressure reading, without diagnosis of hypertension: Secondary | ICD-10-CM | POA: Diagnosis not present

## 2015-08-12 DIAGNOSIS — Z23 Encounter for immunization: Secondary | ICD-10-CM | POA: Diagnosis not present

## 2015-08-14 DIAGNOSIS — L723 Sebaceous cyst: Secondary | ICD-10-CM | POA: Diagnosis not present

## 2015-08-14 DIAGNOSIS — L720 Epidermal cyst: Secondary | ICD-10-CM | POA: Diagnosis not present

## 2015-08-20 ENCOUNTER — Encounter (HOSPITAL_COMMUNITY): Payer: Medicare Other

## 2015-09-22 DIAGNOSIS — M79674 Pain in right toe(s): Secondary | ICD-10-CM | POA: Diagnosis not present

## 2015-09-22 DIAGNOSIS — M722 Plantar fascial fibromatosis: Secondary | ICD-10-CM | POA: Diagnosis not present

## 2015-10-20 DIAGNOSIS — M79674 Pain in right toe(s): Secondary | ICD-10-CM | POA: Diagnosis not present

## 2015-10-20 DIAGNOSIS — L03031 Cellulitis of right toe: Secondary | ICD-10-CM | POA: Diagnosis not present

## 2015-10-27 DIAGNOSIS — M722 Plantar fascial fibromatosis: Secondary | ICD-10-CM | POA: Diagnosis not present

## 2015-10-27 DIAGNOSIS — M65872 Other synovitis and tenosynovitis, left ankle and foot: Secondary | ICD-10-CM | POA: Diagnosis not present

## 2015-10-27 DIAGNOSIS — M792 Neuralgia and neuritis, unspecified: Secondary | ICD-10-CM | POA: Diagnosis not present

## 2015-11-04 DIAGNOSIS — N63 Unspecified lump in breast: Secondary | ICD-10-CM | POA: Diagnosis not present

## 2015-11-10 DIAGNOSIS — M722 Plantar fascial fibromatosis: Secondary | ICD-10-CM | POA: Diagnosis not present

## 2015-12-09 DIAGNOSIS — R8299 Other abnormal findings in urine: Secondary | ICD-10-CM | POA: Diagnosis not present

## 2015-12-09 DIAGNOSIS — N390 Urinary tract infection, site not specified: Secondary | ICD-10-CM | POA: Diagnosis not present

## 2015-12-10 DIAGNOSIS — R0781 Pleurodynia: Secondary | ICD-10-CM | POA: Diagnosis not present

## 2015-12-10 DIAGNOSIS — W19XXXA Unspecified fall, initial encounter: Secondary | ICD-10-CM | POA: Diagnosis not present

## 2015-12-10 DIAGNOSIS — M5441 Lumbago with sciatica, right side: Secondary | ICD-10-CM | POA: Diagnosis not present

## 2015-12-10 DIAGNOSIS — M79604 Pain in right leg: Secondary | ICD-10-CM | POA: Diagnosis not present

## 2016-02-22 DIAGNOSIS — R8299 Other abnormal findings in urine: Secondary | ICD-10-CM | POA: Diagnosis not present

## 2016-02-22 DIAGNOSIS — N390 Urinary tract infection, site not specified: Secondary | ICD-10-CM | POA: Diagnosis not present

## 2016-03-21 DIAGNOSIS — I739 Peripheral vascular disease, unspecified: Secondary | ICD-10-CM | POA: Diagnosis not present

## 2016-03-21 DIAGNOSIS — M722 Plantar fascial fibromatosis: Secondary | ICD-10-CM | POA: Diagnosis not present

## 2016-03-21 DIAGNOSIS — L603 Nail dystrophy: Secondary | ICD-10-CM | POA: Diagnosis not present

## 2016-03-21 DIAGNOSIS — M71572 Other bursitis, not elsewhere classified, left ankle and foot: Secondary | ICD-10-CM | POA: Diagnosis not present

## 2016-03-25 DIAGNOSIS — Z1289 Encounter for screening for malignant neoplasm of other sites: Secondary | ICD-10-CM | POA: Diagnosis not present

## 2016-04-20 DIAGNOSIS — L03032 Cellulitis of left toe: Secondary | ICD-10-CM | POA: Diagnosis not present

## 2016-05-02 DIAGNOSIS — H5211 Myopia, right eye: Secondary | ICD-10-CM | POA: Diagnosis not present

## 2016-05-02 DIAGNOSIS — H04123 Dry eye syndrome of bilateral lacrimal glands: Secondary | ICD-10-CM | POA: Diagnosis not present

## 2016-05-02 DIAGNOSIS — H52223 Regular astigmatism, bilateral: Secondary | ICD-10-CM | POA: Diagnosis not present

## 2016-05-02 DIAGNOSIS — H5202 Hypermetropia, left eye: Secondary | ICD-10-CM | POA: Diagnosis not present

## 2016-05-12 DIAGNOSIS — D225 Melanocytic nevi of trunk: Secondary | ICD-10-CM | POA: Diagnosis not present

## 2016-05-12 DIAGNOSIS — D1801 Hemangioma of skin and subcutaneous tissue: Secondary | ICD-10-CM | POA: Diagnosis not present

## 2016-05-12 DIAGNOSIS — L603 Nail dystrophy: Secondary | ICD-10-CM | POA: Diagnosis not present

## 2016-05-12 DIAGNOSIS — D2272 Melanocytic nevi of left lower limb, including hip: Secondary | ICD-10-CM | POA: Diagnosis not present

## 2016-05-12 DIAGNOSIS — L738 Other specified follicular disorders: Secondary | ICD-10-CM | POA: Diagnosis not present

## 2016-05-12 DIAGNOSIS — L821 Other seborrheic keratosis: Secondary | ICD-10-CM | POA: Diagnosis not present

## 2016-05-12 DIAGNOSIS — L820 Inflamed seborrheic keratosis: Secondary | ICD-10-CM | POA: Diagnosis not present

## 2016-05-12 DIAGNOSIS — L570 Actinic keratosis: Secondary | ICD-10-CM | POA: Diagnosis not present

## 2016-08-11 DIAGNOSIS — R7301 Impaired fasting glucose: Secondary | ICD-10-CM | POA: Diagnosis not present

## 2016-08-11 DIAGNOSIS — R8299 Other abnormal findings in urine: Secondary | ICD-10-CM | POA: Diagnosis not present

## 2016-08-11 DIAGNOSIS — N390 Urinary tract infection, site not specified: Secondary | ICD-10-CM | POA: Diagnosis not present

## 2016-08-11 DIAGNOSIS — M810 Age-related osteoporosis without current pathological fracture: Secondary | ICD-10-CM | POA: Diagnosis not present

## 2016-08-11 DIAGNOSIS — E784 Other hyperlipidemia: Secondary | ICD-10-CM | POA: Diagnosis not present

## 2016-08-16 DIAGNOSIS — I739 Peripheral vascular disease, unspecified: Secondary | ICD-10-CM | POA: Diagnosis not present

## 2016-08-16 DIAGNOSIS — L603 Nail dystrophy: Secondary | ICD-10-CM | POA: Diagnosis not present

## 2016-08-18 DIAGNOSIS — R3121 Asymptomatic microscopic hematuria: Secondary | ICD-10-CM | POA: Diagnosis not present

## 2016-08-18 DIAGNOSIS — Z1389 Encounter for screening for other disorder: Secondary | ICD-10-CM | POA: Diagnosis not present

## 2016-08-18 DIAGNOSIS — R7309 Other abnormal glucose: Secondary | ICD-10-CM | POA: Diagnosis not present

## 2016-08-18 DIAGNOSIS — E784 Other hyperlipidemia: Secondary | ICD-10-CM | POA: Diagnosis not present

## 2016-08-18 DIAGNOSIS — Z23 Encounter for immunization: Secondary | ICD-10-CM | POA: Diagnosis not present

## 2016-08-18 DIAGNOSIS — F418 Other specified anxiety disorders: Secondary | ICD-10-CM | POA: Diagnosis not present

## 2016-08-18 DIAGNOSIS — M810 Age-related osteoporosis without current pathological fracture: Secondary | ICD-10-CM | POA: Diagnosis not present

## 2016-08-18 DIAGNOSIS — Z6825 Body mass index (BMI) 25.0-25.9, adult: Secondary | ICD-10-CM | POA: Diagnosis not present

## 2016-08-18 DIAGNOSIS — R030 Elevated blood-pressure reading, without diagnosis of hypertension: Secondary | ICD-10-CM | POA: Diagnosis not present

## 2016-08-18 DIAGNOSIS — Z0000 Encounter for general adult medical examination without abnormal findings: Secondary | ICD-10-CM | POA: Diagnosis not present

## 2016-11-02 DIAGNOSIS — L603 Nail dystrophy: Secondary | ICD-10-CM | POA: Diagnosis not present

## 2016-11-02 DIAGNOSIS — I739 Peripheral vascular disease, unspecified: Secondary | ICD-10-CM | POA: Diagnosis not present

## 2016-11-11 DIAGNOSIS — Z1231 Encounter for screening mammogram for malignant neoplasm of breast: Secondary | ICD-10-CM | POA: Diagnosis not present

## 2017-02-21 DIAGNOSIS — B351 Tinea unguium: Secondary | ICD-10-CM | POA: Diagnosis not present

## 2017-02-21 DIAGNOSIS — M79609 Pain in unspecified limb: Secondary | ICD-10-CM | POA: Diagnosis not present

## 2017-04-07 DIAGNOSIS — Z1289 Encounter for screening for malignant neoplasm of other sites: Secondary | ICD-10-CM | POA: Diagnosis not present

## 2017-04-11 DIAGNOSIS — H52221 Regular astigmatism, right eye: Secondary | ICD-10-CM | POA: Diagnosis not present

## 2017-04-11 DIAGNOSIS — H3530 Unspecified macular degeneration: Secondary | ICD-10-CM | POA: Diagnosis not present

## 2017-04-11 DIAGNOSIS — H5212 Myopia, left eye: Secondary | ICD-10-CM | POA: Diagnosis not present

## 2017-04-11 DIAGNOSIS — H04123 Dry eye syndrome of bilateral lacrimal glands: Secondary | ICD-10-CM | POA: Diagnosis not present

## 2017-05-16 DIAGNOSIS — M79675 Pain in left toe(s): Secondary | ICD-10-CM | POA: Diagnosis not present

## 2017-05-16 DIAGNOSIS — B351 Tinea unguium: Secondary | ICD-10-CM | POA: Diagnosis not present

## 2017-07-13 DIAGNOSIS — L814 Other melanin hyperpigmentation: Secondary | ICD-10-CM | POA: Diagnosis not present

## 2017-07-13 DIAGNOSIS — D1801 Hemangioma of skin and subcutaneous tissue: Secondary | ICD-10-CM | POA: Diagnosis not present

## 2017-07-13 DIAGNOSIS — L820 Inflamed seborrheic keratosis: Secondary | ICD-10-CM | POA: Diagnosis not present

## 2017-07-13 DIAGNOSIS — D2271 Melanocytic nevi of right lower limb, including hip: Secondary | ICD-10-CM | POA: Diagnosis not present

## 2017-07-13 DIAGNOSIS — D225 Melanocytic nevi of trunk: Secondary | ICD-10-CM | POA: Diagnosis not present

## 2017-07-13 DIAGNOSIS — D2261 Melanocytic nevi of right upper limb, including shoulder: Secondary | ICD-10-CM | POA: Diagnosis not present

## 2017-07-13 DIAGNOSIS — L309 Dermatitis, unspecified: Secondary | ICD-10-CM | POA: Diagnosis not present

## 2017-07-13 DIAGNOSIS — D2262 Melanocytic nevi of left upper limb, including shoulder: Secondary | ICD-10-CM | POA: Diagnosis not present

## 2017-07-13 DIAGNOSIS — L821 Other seborrheic keratosis: Secondary | ICD-10-CM | POA: Diagnosis not present

## 2017-08-14 DIAGNOSIS — M810 Age-related osteoporosis without current pathological fracture: Secondary | ICD-10-CM | POA: Diagnosis not present

## 2017-08-14 DIAGNOSIS — R7301 Impaired fasting glucose: Secondary | ICD-10-CM | POA: Diagnosis not present

## 2017-08-14 DIAGNOSIS — E7849 Other hyperlipidemia: Secondary | ICD-10-CM | POA: Diagnosis not present

## 2017-08-16 DIAGNOSIS — M545 Low back pain: Secondary | ICD-10-CM | POA: Diagnosis not present

## 2017-08-16 DIAGNOSIS — S32020A Wedge compression fracture of second lumbar vertebra, initial encounter for closed fracture: Secondary | ICD-10-CM | POA: Diagnosis not present

## 2017-08-18 DIAGNOSIS — L603 Nail dystrophy: Secondary | ICD-10-CM | POA: Diagnosis not present

## 2017-08-18 DIAGNOSIS — I739 Peripheral vascular disease, unspecified: Secondary | ICD-10-CM | POA: Diagnosis not present

## 2017-08-18 DIAGNOSIS — M79675 Pain in left toe(s): Secondary | ICD-10-CM | POA: Diagnosis not present

## 2017-08-18 DIAGNOSIS — B351 Tinea unguium: Secondary | ICD-10-CM | POA: Diagnosis not present

## 2017-08-18 DIAGNOSIS — M79674 Pain in right toe(s): Secondary | ICD-10-CM | POA: Diagnosis not present

## 2017-08-24 DIAGNOSIS — E7849 Other hyperlipidemia: Secondary | ICD-10-CM | POA: Diagnosis not present

## 2017-08-24 DIAGNOSIS — Z23 Encounter for immunization: Secondary | ICD-10-CM | POA: Diagnosis not present

## 2017-08-24 DIAGNOSIS — M4854XA Collapsed vertebra, not elsewhere classified, thoracic region, initial encounter for fracture: Secondary | ICD-10-CM | POA: Diagnosis not present

## 2017-08-24 DIAGNOSIS — Z6825 Body mass index (BMI) 25.0-25.9, adult: Secondary | ICD-10-CM | POA: Diagnosis not present

## 2017-08-24 DIAGNOSIS — Z1389 Encounter for screening for other disorder: Secondary | ICD-10-CM | POA: Diagnosis not present

## 2017-08-24 DIAGNOSIS — M810 Age-related osteoporosis without current pathological fracture: Secondary | ICD-10-CM | POA: Diagnosis not present

## 2017-08-24 DIAGNOSIS — K449 Diaphragmatic hernia without obstruction or gangrene: Secondary | ICD-10-CM | POA: Diagnosis not present

## 2017-08-24 DIAGNOSIS — Z0000 Encounter for general adult medical examination without abnormal findings: Secondary | ICD-10-CM | POA: Diagnosis not present

## 2017-08-24 DIAGNOSIS — R7309 Other abnormal glucose: Secondary | ICD-10-CM | POA: Diagnosis not present

## 2017-08-24 DIAGNOSIS — R82998 Other abnormal findings in urine: Secondary | ICD-10-CM | POA: Diagnosis not present

## 2017-08-24 DIAGNOSIS — R030 Elevated blood-pressure reading, without diagnosis of hypertension: Secondary | ICD-10-CM | POA: Diagnosis not present

## 2017-08-25 DIAGNOSIS — Z1212 Encounter for screening for malignant neoplasm of rectum: Secondary | ICD-10-CM | POA: Diagnosis not present

## 2017-08-30 DIAGNOSIS — S32020D Wedge compression fracture of second lumbar vertebra, subsequent encounter for fracture with routine healing: Secondary | ICD-10-CM | POA: Diagnosis not present

## 2017-08-30 DIAGNOSIS — M545 Low back pain: Secondary | ICD-10-CM | POA: Diagnosis not present

## 2017-09-18 DIAGNOSIS — S32000D Wedge compression fracture of unspecified lumbar vertebra, subsequent encounter for fracture with routine healing: Secondary | ICD-10-CM | POA: Diagnosis not present

## 2017-10-10 DIAGNOSIS — S32000D Wedge compression fracture of unspecified lumbar vertebra, subsequent encounter for fracture with routine healing: Secondary | ICD-10-CM | POA: Diagnosis not present

## 2017-10-10 DIAGNOSIS — M79604 Pain in right leg: Secondary | ICD-10-CM | POA: Diagnosis not present

## 2017-10-10 DIAGNOSIS — M545 Low back pain: Secondary | ICD-10-CM | POA: Diagnosis not present

## 2017-10-12 ENCOUNTER — Other Ambulatory Visit: Payer: Self-pay | Admitting: Sports Medicine

## 2017-10-12 DIAGNOSIS — S32000D Wedge compression fracture of unspecified lumbar vertebra, subsequent encounter for fracture with routine healing: Secondary | ICD-10-CM

## 2017-10-16 ENCOUNTER — Ambulatory Visit
Admission: RE | Admit: 2017-10-16 | Discharge: 2017-10-16 | Disposition: A | Discharge status: Home / Self Care | Payer: Medicare Other | Source: Ambulatory Visit | Attending: Sports Medicine | Admitting: Sports Medicine

## 2017-10-16 DIAGNOSIS — S22010A Wedge compression fracture of first thoracic vertebra, initial encounter for closed fracture: Secondary | ICD-10-CM | POA: Diagnosis not present

## 2017-10-16 DIAGNOSIS — S32000D Wedge compression fracture of unspecified lumbar vertebra, subsequent encounter for fracture with routine healing: Secondary | ICD-10-CM

## 2017-10-20 DIAGNOSIS — M79604 Pain in right leg: Secondary | ICD-10-CM | POA: Diagnosis not present

## 2017-10-20 DIAGNOSIS — S32000D Wedge compression fracture of unspecified lumbar vertebra, subsequent encounter for fracture with routine healing: Secondary | ICD-10-CM | POA: Diagnosis not present

## 2017-10-20 DIAGNOSIS — M545 Low back pain: Secondary | ICD-10-CM | POA: Diagnosis not present

## 2017-11-15 DIAGNOSIS — M25552 Pain in left hip: Secondary | ICD-10-CM | POA: Diagnosis not present

## 2017-11-15 DIAGNOSIS — S32000D Wedge compression fracture of unspecified lumbar vertebra, subsequent encounter for fracture with routine healing: Secondary | ICD-10-CM | POA: Diagnosis not present

## 2017-11-15 DIAGNOSIS — M545 Low back pain: Secondary | ICD-10-CM | POA: Diagnosis not present

## 2017-11-20 DIAGNOSIS — L603 Nail dystrophy: Secondary | ICD-10-CM | POA: Diagnosis not present

## 2017-11-20 DIAGNOSIS — B351 Tinea unguium: Secondary | ICD-10-CM | POA: Diagnosis not present

## 2017-11-20 DIAGNOSIS — M79675 Pain in left toe(s): Secondary | ICD-10-CM | POA: Diagnosis not present

## 2017-11-20 DIAGNOSIS — M79674 Pain in right toe(s): Secondary | ICD-10-CM | POA: Diagnosis not present

## 2017-11-20 DIAGNOSIS — I739 Peripheral vascular disease, unspecified: Secondary | ICD-10-CM | POA: Diagnosis not present

## 2017-12-08 DIAGNOSIS — M545 Low back pain: Secondary | ICD-10-CM | POA: Diagnosis not present

## 2017-12-08 DIAGNOSIS — S32000D Wedge compression fracture of unspecified lumbar vertebra, subsequent encounter for fracture with routine healing: Secondary | ICD-10-CM | POA: Diagnosis not present

## 2017-12-08 DIAGNOSIS — M79604 Pain in right leg: Secondary | ICD-10-CM | POA: Diagnosis not present

## 2017-12-12 DIAGNOSIS — M4726 Other spondylosis with radiculopathy, lumbar region: Secondary | ICD-10-CM | POA: Diagnosis not present

## 2017-12-12 DIAGNOSIS — M9903 Segmental and somatic dysfunction of lumbar region: Secondary | ICD-10-CM | POA: Diagnosis not present

## 2017-12-13 DIAGNOSIS — M4726 Other spondylosis with radiculopathy, lumbar region: Secondary | ICD-10-CM | POA: Diagnosis not present

## 2017-12-13 DIAGNOSIS — M9903 Segmental and somatic dysfunction of lumbar region: Secondary | ICD-10-CM | POA: Diagnosis not present

## 2017-12-18 DIAGNOSIS — M9903 Segmental and somatic dysfunction of lumbar region: Secondary | ICD-10-CM | POA: Diagnosis not present

## 2017-12-18 DIAGNOSIS — M4726 Other spondylosis with radiculopathy, lumbar region: Secondary | ICD-10-CM | POA: Diagnosis not present

## 2017-12-19 DIAGNOSIS — M9903 Segmental and somatic dysfunction of lumbar region: Secondary | ICD-10-CM | POA: Diagnosis not present

## 2017-12-19 DIAGNOSIS — M4726 Other spondylosis with radiculopathy, lumbar region: Secondary | ICD-10-CM | POA: Diagnosis not present

## 2017-12-20 DIAGNOSIS — M9903 Segmental and somatic dysfunction of lumbar region: Secondary | ICD-10-CM | POA: Diagnosis not present

## 2017-12-20 DIAGNOSIS — M4726 Other spondylosis with radiculopathy, lumbar region: Secondary | ICD-10-CM | POA: Diagnosis not present

## 2017-12-25 DIAGNOSIS — M4726 Other spondylosis with radiculopathy, lumbar region: Secondary | ICD-10-CM | POA: Diagnosis not present

## 2017-12-25 DIAGNOSIS — M9903 Segmental and somatic dysfunction of lumbar region: Secondary | ICD-10-CM | POA: Diagnosis not present

## 2018-01-01 DIAGNOSIS — M4726 Other spondylosis with radiculopathy, lumbar region: Secondary | ICD-10-CM | POA: Diagnosis not present

## 2018-01-01 DIAGNOSIS — M9903 Segmental and somatic dysfunction of lumbar region: Secondary | ICD-10-CM | POA: Diagnosis not present

## 2018-01-02 DIAGNOSIS — M9903 Segmental and somatic dysfunction of lumbar region: Secondary | ICD-10-CM | POA: Diagnosis not present

## 2018-01-02 DIAGNOSIS — M4726 Other spondylosis with radiculopathy, lumbar region: Secondary | ICD-10-CM | POA: Diagnosis not present

## 2018-01-03 DIAGNOSIS — M9903 Segmental and somatic dysfunction of lumbar region: Secondary | ICD-10-CM | POA: Diagnosis not present

## 2018-01-03 DIAGNOSIS — M4726 Other spondylosis with radiculopathy, lumbar region: Secondary | ICD-10-CM | POA: Diagnosis not present

## 2018-01-08 DIAGNOSIS — M4726 Other spondylosis with radiculopathy, lumbar region: Secondary | ICD-10-CM | POA: Diagnosis not present

## 2018-01-08 DIAGNOSIS — M9903 Segmental and somatic dysfunction of lumbar region: Secondary | ICD-10-CM | POA: Diagnosis not present

## 2018-01-09 DIAGNOSIS — M79604 Pain in right leg: Secondary | ICD-10-CM | POA: Diagnosis not present

## 2018-01-09 DIAGNOSIS — M545 Low back pain: Secondary | ICD-10-CM | POA: Diagnosis not present

## 2018-01-09 DIAGNOSIS — S32000D Wedge compression fracture of unspecified lumbar vertebra, subsequent encounter for fracture with routine healing: Secondary | ICD-10-CM | POA: Diagnosis not present

## 2018-01-10 DIAGNOSIS — M4726 Other spondylosis with radiculopathy, lumbar region: Secondary | ICD-10-CM | POA: Diagnosis not present

## 2018-01-10 DIAGNOSIS — M9903 Segmental and somatic dysfunction of lumbar region: Secondary | ICD-10-CM | POA: Diagnosis not present

## 2018-01-12 DIAGNOSIS — Z1231 Encounter for screening mammogram for malignant neoplasm of breast: Secondary | ICD-10-CM | POA: Diagnosis not present

## 2018-01-15 DIAGNOSIS — M545 Low back pain: Secondary | ICD-10-CM | POA: Diagnosis not present

## 2018-01-19 DIAGNOSIS — M545 Low back pain: Secondary | ICD-10-CM | POA: Diagnosis not present

## 2018-01-22 DIAGNOSIS — M545 Low back pain: Secondary | ICD-10-CM | POA: Diagnosis not present

## 2018-01-26 DIAGNOSIS — M545 Low back pain: Secondary | ICD-10-CM | POA: Diagnosis not present

## 2018-01-29 DIAGNOSIS — M545 Low back pain: Secondary | ICD-10-CM | POA: Diagnosis not present

## 2018-02-01 DIAGNOSIS — M545 Low back pain: Secondary | ICD-10-CM | POA: Diagnosis not present

## 2018-02-20 DIAGNOSIS — S32000D Wedge compression fracture of unspecified lumbar vertebra, subsequent encounter for fracture with routine healing: Secondary | ICD-10-CM | POA: Diagnosis not present

## 2018-02-20 DIAGNOSIS — M545 Low back pain: Secondary | ICD-10-CM | POA: Diagnosis not present

## 2018-02-22 DIAGNOSIS — M79674 Pain in right toe(s): Secondary | ICD-10-CM | POA: Diagnosis not present

## 2018-02-22 DIAGNOSIS — B351 Tinea unguium: Secondary | ICD-10-CM | POA: Diagnosis not present

## 2018-02-22 DIAGNOSIS — I739 Peripheral vascular disease, unspecified: Secondary | ICD-10-CM | POA: Diagnosis not present

## 2018-02-22 DIAGNOSIS — L603 Nail dystrophy: Secondary | ICD-10-CM | POA: Diagnosis not present

## 2018-02-22 DIAGNOSIS — M79675 Pain in left toe(s): Secondary | ICD-10-CM | POA: Diagnosis not present

## 2018-04-17 DIAGNOSIS — H524 Presbyopia: Secondary | ICD-10-CM | POA: Diagnosis not present

## 2018-04-17 DIAGNOSIS — Z961 Presence of intraocular lens: Secondary | ICD-10-CM | POA: Diagnosis not present

## 2018-04-17 DIAGNOSIS — H5202 Hypermetropia, left eye: Secondary | ICD-10-CM | POA: Diagnosis not present

## 2018-04-17 DIAGNOSIS — Z9849 Cataract extraction status, unspecified eye: Secondary | ICD-10-CM | POA: Diagnosis not present

## 2018-04-17 DIAGNOSIS — H353122 Nonexudative age-related macular degeneration, left eye, intermediate dry stage: Secondary | ICD-10-CM | POA: Diagnosis not present

## 2018-04-17 DIAGNOSIS — H52223 Regular astigmatism, bilateral: Secondary | ICD-10-CM | POA: Diagnosis not present

## 2018-04-17 DIAGNOSIS — H43813 Vitreous degeneration, bilateral: Secondary | ICD-10-CM | POA: Diagnosis not present

## 2018-05-23 DIAGNOSIS — L603 Nail dystrophy: Secondary | ICD-10-CM | POA: Diagnosis not present

## 2018-05-23 DIAGNOSIS — I739 Peripheral vascular disease, unspecified: Secondary | ICD-10-CM | POA: Diagnosis not present

## 2018-05-23 DIAGNOSIS — B351 Tinea unguium: Secondary | ICD-10-CM | POA: Diagnosis not present

## 2018-05-23 DIAGNOSIS — M79674 Pain in right toe(s): Secondary | ICD-10-CM | POA: Diagnosis not present

## 2018-05-23 DIAGNOSIS — M79675 Pain in left toe(s): Secondary | ICD-10-CM | POA: Diagnosis not present

## 2018-07-15 DIAGNOSIS — Z23 Encounter for immunization: Secondary | ICD-10-CM | POA: Diagnosis not present

## 2018-08-23 DIAGNOSIS — R7309 Other abnormal glucose: Secondary | ICD-10-CM | POA: Diagnosis not present

## 2018-08-23 DIAGNOSIS — M810 Age-related osteoporosis without current pathological fracture: Secondary | ICD-10-CM | POA: Diagnosis not present

## 2018-08-23 DIAGNOSIS — E7849 Other hyperlipidemia: Secondary | ICD-10-CM | POA: Diagnosis not present

## 2018-08-29 DIAGNOSIS — M79674 Pain in right toe(s): Secondary | ICD-10-CM | POA: Diagnosis not present

## 2018-08-29 DIAGNOSIS — L603 Nail dystrophy: Secondary | ICD-10-CM | POA: Diagnosis not present

## 2018-08-29 DIAGNOSIS — I739 Peripheral vascular disease, unspecified: Secondary | ICD-10-CM | POA: Diagnosis not present

## 2018-08-29 DIAGNOSIS — M79675 Pain in left toe(s): Secondary | ICD-10-CM | POA: Diagnosis not present

## 2018-08-29 DIAGNOSIS — B351 Tinea unguium: Secondary | ICD-10-CM | POA: Diagnosis not present

## 2018-08-30 DIAGNOSIS — E7849 Other hyperlipidemia: Secondary | ICD-10-CM | POA: Diagnosis not present

## 2018-08-30 DIAGNOSIS — R3121 Asymptomatic microscopic hematuria: Secondary | ICD-10-CM | POA: Diagnosis not present

## 2018-08-30 DIAGNOSIS — Z1389 Encounter for screening for other disorder: Secondary | ICD-10-CM | POA: Diagnosis not present

## 2018-08-30 DIAGNOSIS — Z6825 Body mass index (BMI) 25.0-25.9, adult: Secondary | ICD-10-CM | POA: Diagnosis not present

## 2018-08-30 DIAGNOSIS — K449 Diaphragmatic hernia without obstruction or gangrene: Secondary | ICD-10-CM | POA: Diagnosis not present

## 2018-08-30 DIAGNOSIS — R82998 Other abnormal findings in urine: Secondary | ICD-10-CM | POA: Diagnosis not present

## 2018-08-30 DIAGNOSIS — R7301 Impaired fasting glucose: Secondary | ICD-10-CM | POA: Diagnosis not present

## 2018-08-30 DIAGNOSIS — R030 Elevated blood-pressure reading, without diagnosis of hypertension: Secondary | ICD-10-CM | POA: Diagnosis not present

## 2018-08-30 DIAGNOSIS — Z0000 Encounter for general adult medical examination without abnormal findings: Secondary | ICD-10-CM | POA: Diagnosis not present

## 2018-08-30 DIAGNOSIS — M810 Age-related osteoporosis without current pathological fracture: Secondary | ICD-10-CM | POA: Diagnosis not present

## 2018-08-31 DIAGNOSIS — Z1212 Encounter for screening for malignant neoplasm of rectum: Secondary | ICD-10-CM | POA: Diagnosis not present

## 2018-09-17 DIAGNOSIS — L600 Ingrowing nail: Secondary | ICD-10-CM | POA: Diagnosis not present

## 2018-09-27 DIAGNOSIS — L814 Other melanin hyperpigmentation: Secondary | ICD-10-CM | POA: Diagnosis not present

## 2018-09-27 DIAGNOSIS — L820 Inflamed seborrheic keratosis: Secondary | ICD-10-CM | POA: Diagnosis not present

## 2018-09-27 DIAGNOSIS — D2262 Melanocytic nevi of left upper limb, including shoulder: Secondary | ICD-10-CM | POA: Diagnosis not present

## 2018-09-27 DIAGNOSIS — L918 Other hypertrophic disorders of the skin: Secondary | ICD-10-CM | POA: Diagnosis not present

## 2018-09-27 DIAGNOSIS — D225 Melanocytic nevi of trunk: Secondary | ICD-10-CM | POA: Diagnosis not present

## 2018-09-27 DIAGNOSIS — D2272 Melanocytic nevi of left lower limb, including hip: Secondary | ICD-10-CM | POA: Diagnosis not present

## 2018-09-27 DIAGNOSIS — L821 Other seborrheic keratosis: Secondary | ICD-10-CM | POA: Diagnosis not present

## 2018-09-27 DIAGNOSIS — D2271 Melanocytic nevi of right lower limb, including hip: Secondary | ICD-10-CM | POA: Diagnosis not present

## 2018-09-27 DIAGNOSIS — D1801 Hemangioma of skin and subcutaneous tissue: Secondary | ICD-10-CM | POA: Diagnosis not present

## 2019-06-27 DIAGNOSIS — Z23 Encounter for immunization: Secondary | ICD-10-CM | POA: Diagnosis not present

## 2019-07-22 DIAGNOSIS — M25522 Pain in left elbow: Secondary | ICD-10-CM | POA: Diagnosis not present

## 2019-07-22 DIAGNOSIS — M7712 Lateral epicondylitis, left elbow: Secondary | ICD-10-CM | POA: Diagnosis not present

## 2019-08-13 DIAGNOSIS — L03031 Cellulitis of right toe: Secondary | ICD-10-CM | POA: Diagnosis not present

## 2019-09-20 DIAGNOSIS — Z1212 Encounter for screening for malignant neoplasm of rectum: Secondary | ICD-10-CM | POA: Diagnosis not present

## 2019-09-23 DIAGNOSIS — R030 Elevated blood-pressure reading, without diagnosis of hypertension: Secondary | ICD-10-CM | POA: Diagnosis not present

## 2019-09-23 DIAGNOSIS — Z1331 Encounter for screening for depression: Secondary | ICD-10-CM | POA: Diagnosis not present

## 2019-09-23 DIAGNOSIS — F419 Anxiety disorder, unspecified: Secondary | ICD-10-CM | POA: Diagnosis not present

## 2019-09-23 DIAGNOSIS — Z0000 Encounter for general adult medical examination without abnormal findings: Secondary | ICD-10-CM | POA: Diagnosis not present

## 2019-09-23 DIAGNOSIS — R7301 Impaired fasting glucose: Secondary | ICD-10-CM | POA: Diagnosis not present

## 2019-09-23 DIAGNOSIS — Z1339 Encounter for screening examination for other mental health and behavioral disorders: Secondary | ICD-10-CM | POA: Diagnosis not present

## 2019-09-23 DIAGNOSIS — D72829 Elevated white blood cell count, unspecified: Secondary | ICD-10-CM | POA: Diagnosis not present

## 2019-09-23 DIAGNOSIS — K580 Irritable bowel syndrome with diarrhea: Secondary | ICD-10-CM | POA: Diagnosis not present

## 2019-09-23 DIAGNOSIS — E785 Hyperlipidemia, unspecified: Secondary | ICD-10-CM | POA: Diagnosis not present

## 2019-09-23 DIAGNOSIS — M810 Age-related osteoporosis without current pathological fracture: Secondary | ICD-10-CM | POA: Diagnosis not present

## 2019-09-23 DIAGNOSIS — R202 Paresthesia of skin: Secondary | ICD-10-CM | POA: Diagnosis not present

## 2019-10-10 DIAGNOSIS — M79641 Pain in right hand: Secondary | ICD-10-CM | POA: Diagnosis not present

## 2019-10-10 DIAGNOSIS — M79642 Pain in left hand: Secondary | ICD-10-CM | POA: Diagnosis not present

## 2019-10-16 DIAGNOSIS — M79642 Pain in left hand: Secondary | ICD-10-CM | POA: Diagnosis not present

## 2019-10-16 DIAGNOSIS — G5603 Carpal tunnel syndrome, bilateral upper limbs: Secondary | ICD-10-CM | POA: Diagnosis not present

## 2019-10-16 DIAGNOSIS — M79641 Pain in right hand: Secondary | ICD-10-CM | POA: Diagnosis not present

## 2019-11-14 DIAGNOSIS — M79642 Pain in left hand: Secondary | ICD-10-CM | POA: Diagnosis not present

## 2019-11-14 DIAGNOSIS — M13849 Other specified arthritis, unspecified hand: Secondary | ICD-10-CM | POA: Diagnosis not present

## 2019-11-14 DIAGNOSIS — G5603 Carpal tunnel syndrome, bilateral upper limbs: Secondary | ICD-10-CM | POA: Diagnosis not present

## 2019-11-14 DIAGNOSIS — M79641 Pain in right hand: Secondary | ICD-10-CM | POA: Diagnosis not present

## 2019-11-22 DIAGNOSIS — G5602 Carpal tunnel syndrome, left upper limb: Secondary | ICD-10-CM | POA: Diagnosis not present

## 2019-12-05 DIAGNOSIS — D1801 Hemangioma of skin and subcutaneous tissue: Secondary | ICD-10-CM | POA: Diagnosis not present

## 2019-12-05 DIAGNOSIS — D225 Melanocytic nevi of trunk: Secondary | ICD-10-CM | POA: Diagnosis not present

## 2019-12-05 DIAGNOSIS — L821 Other seborrheic keratosis: Secondary | ICD-10-CM | POA: Diagnosis not present

## 2019-12-05 DIAGNOSIS — L814 Other melanin hyperpigmentation: Secondary | ICD-10-CM | POA: Diagnosis not present

## 2019-12-06 DIAGNOSIS — M79642 Pain in left hand: Secondary | ICD-10-CM | POA: Diagnosis not present

## 2019-12-13 DIAGNOSIS — M79642 Pain in left hand: Secondary | ICD-10-CM | POA: Diagnosis not present

## 2019-12-18 DIAGNOSIS — B351 Tinea unguium: Secondary | ICD-10-CM | POA: Diagnosis not present

## 2019-12-18 DIAGNOSIS — L600 Ingrowing nail: Secondary | ICD-10-CM | POA: Diagnosis not present

## 2019-12-19 DIAGNOSIS — G5603 Carpal tunnel syndrome, bilateral upper limbs: Secondary | ICD-10-CM | POA: Diagnosis not present

## 2019-12-19 DIAGNOSIS — M79642 Pain in left hand: Secondary | ICD-10-CM | POA: Diagnosis not present

## 2019-12-19 DIAGNOSIS — G5601 Carpal tunnel syndrome, right upper limb: Secondary | ICD-10-CM | POA: Diagnosis not present

## 2019-12-19 DIAGNOSIS — G5602 Carpal tunnel syndrome, left upper limb: Secondary | ICD-10-CM | POA: Diagnosis not present

## 2019-12-19 DIAGNOSIS — Z4789 Encounter for other orthopedic aftercare: Secondary | ICD-10-CM | POA: Diagnosis not present

## 2020-01-20 DIAGNOSIS — G5601 Carpal tunnel syndrome, right upper limb: Secondary | ICD-10-CM | POA: Diagnosis not present

## 2020-01-20 DIAGNOSIS — G5603 Carpal tunnel syndrome, bilateral upper limbs: Secondary | ICD-10-CM | POA: Diagnosis not present

## 2020-01-20 DIAGNOSIS — G5602 Carpal tunnel syndrome, left upper limb: Secondary | ICD-10-CM | POA: Diagnosis not present

## 2020-02-07 DIAGNOSIS — G5601 Carpal tunnel syndrome, right upper limb: Secondary | ICD-10-CM | POA: Diagnosis not present

## 2020-02-21 DIAGNOSIS — M79641 Pain in right hand: Secondary | ICD-10-CM | POA: Diagnosis not present

## 2020-04-17 DIAGNOSIS — Z9849 Cataract extraction status, unspecified eye: Secondary | ICD-10-CM | POA: Diagnosis not present

## 2020-04-17 DIAGNOSIS — H52223 Regular astigmatism, bilateral: Secondary | ICD-10-CM | POA: Diagnosis not present

## 2020-04-17 DIAGNOSIS — H524 Presbyopia: Secondary | ICD-10-CM | POA: Diagnosis not present

## 2020-04-17 DIAGNOSIS — H5202 Hypermetropia, left eye: Secondary | ICD-10-CM | POA: Diagnosis not present

## 2020-04-17 DIAGNOSIS — Z961 Presence of intraocular lens: Secondary | ICD-10-CM | POA: Diagnosis not present

## 2020-04-20 DIAGNOSIS — G5761 Lesion of plantar nerve, right lower limb: Secondary | ICD-10-CM | POA: Diagnosis not present

## 2020-04-20 DIAGNOSIS — M25571 Pain in right ankle and joints of right foot: Secondary | ICD-10-CM | POA: Diagnosis not present

## 2020-04-24 DIAGNOSIS — C4442 Squamous cell carcinoma of skin of scalp and neck: Secondary | ICD-10-CM | POA: Diagnosis not present

## 2020-04-24 DIAGNOSIS — L821 Other seborrheic keratosis: Secondary | ICD-10-CM | POA: Diagnosis not present

## 2020-04-24 DIAGNOSIS — D485 Neoplasm of uncertain behavior of skin: Secondary | ICD-10-CM | POA: Diagnosis not present

## 2020-05-25 DIAGNOSIS — Z1231 Encounter for screening mammogram for malignant neoplasm of breast: Secondary | ICD-10-CM | POA: Diagnosis not present

## 2020-06-28 DIAGNOSIS — Z23 Encounter for immunization: Secondary | ICD-10-CM | POA: Diagnosis not present

## 2020-07-08 DIAGNOSIS — Z23 Encounter for immunization: Secondary | ICD-10-CM | POA: Diagnosis not present

## 2020-07-20 DIAGNOSIS — L603 Nail dystrophy: Secondary | ICD-10-CM | POA: Diagnosis not present

## 2020-07-20 DIAGNOSIS — I739 Peripheral vascular disease, unspecified: Secondary | ICD-10-CM | POA: Diagnosis not present

## 2020-09-10 DIAGNOSIS — L821 Other seborrheic keratosis: Secondary | ICD-10-CM | POA: Diagnosis not present

## 2020-09-10 DIAGNOSIS — Z85828 Personal history of other malignant neoplasm of skin: Secondary | ICD-10-CM | POA: Diagnosis not present

## 2020-09-18 DIAGNOSIS — E785 Hyperlipidemia, unspecified: Secondary | ICD-10-CM | POA: Diagnosis not present

## 2020-09-18 DIAGNOSIS — R7301 Impaired fasting glucose: Secondary | ICD-10-CM | POA: Diagnosis not present

## 2020-09-18 DIAGNOSIS — M810 Age-related osteoporosis without current pathological fracture: Secondary | ICD-10-CM | POA: Diagnosis not present

## 2020-09-25 DIAGNOSIS — E785 Hyperlipidemia, unspecified: Secondary | ICD-10-CM | POA: Diagnosis not present

## 2020-09-25 DIAGNOSIS — Z0000 Encounter for general adult medical examination without abnormal findings: Secondary | ICD-10-CM | POA: Diagnosis not present

## 2020-09-25 DIAGNOSIS — R82998 Other abnormal findings in urine: Secondary | ICD-10-CM | POA: Diagnosis not present

## 2020-09-25 DIAGNOSIS — F419 Anxiety disorder, unspecified: Secondary | ICD-10-CM | POA: Diagnosis not present

## 2020-09-25 DIAGNOSIS — R030 Elevated blood-pressure reading, without diagnosis of hypertension: Secondary | ICD-10-CM | POA: Diagnosis not present

## 2020-09-25 DIAGNOSIS — K580 Irritable bowel syndrome with diarrhea: Secondary | ICD-10-CM | POA: Diagnosis not present

## 2020-09-25 DIAGNOSIS — R739 Hyperglycemia, unspecified: Secondary | ICD-10-CM | POA: Diagnosis not present

## 2020-09-25 DIAGNOSIS — M810 Age-related osteoporosis without current pathological fracture: Secondary | ICD-10-CM | POA: Diagnosis not present

## 2020-11-03 DIAGNOSIS — I739 Peripheral vascular disease, unspecified: Secondary | ICD-10-CM | POA: Diagnosis not present

## 2020-11-03 DIAGNOSIS — L603 Nail dystrophy: Secondary | ICD-10-CM | POA: Diagnosis not present

## 2021-01-12 DIAGNOSIS — H524 Presbyopia: Secondary | ICD-10-CM | POA: Diagnosis not present

## 2021-01-12 DIAGNOSIS — D3132 Benign neoplasm of left choroid: Secondary | ICD-10-CM | POA: Diagnosis not present

## 2021-01-12 DIAGNOSIS — H52223 Regular astigmatism, bilateral: Secondary | ICD-10-CM | POA: Diagnosis not present

## 2021-01-12 DIAGNOSIS — H3554 Dystrophies primarily involving the retinal pigment epithelium: Secondary | ICD-10-CM | POA: Diagnosis not present

## 2021-01-12 DIAGNOSIS — H5202 Hypermetropia, left eye: Secondary | ICD-10-CM | POA: Diagnosis not present

## 2021-02-03 DIAGNOSIS — I739 Peripheral vascular disease, unspecified: Secondary | ICD-10-CM | POA: Diagnosis not present

## 2021-02-03 DIAGNOSIS — L603 Nail dystrophy: Secondary | ICD-10-CM | POA: Diagnosis not present

## 2021-03-22 DIAGNOSIS — M25522 Pain in left elbow: Secondary | ICD-10-CM | POA: Diagnosis not present

## 2021-03-26 DIAGNOSIS — L820 Inflamed seborrheic keratosis: Secondary | ICD-10-CM | POA: Diagnosis not present

## 2021-03-26 DIAGNOSIS — D1801 Hemangioma of skin and subcutaneous tissue: Secondary | ICD-10-CM | POA: Diagnosis not present

## 2021-03-26 DIAGNOSIS — Z85828 Personal history of other malignant neoplasm of skin: Secondary | ICD-10-CM | POA: Diagnosis not present

## 2021-03-26 DIAGNOSIS — L821 Other seborrheic keratosis: Secondary | ICD-10-CM | POA: Diagnosis not present

## 2021-05-06 DIAGNOSIS — L603 Nail dystrophy: Secondary | ICD-10-CM | POA: Diagnosis not present

## 2021-05-06 DIAGNOSIS — I739 Peripheral vascular disease, unspecified: Secondary | ICD-10-CM | POA: Diagnosis not present

## 2021-07-05 DIAGNOSIS — H3554 Dystrophies primarily involving the retinal pigment epithelium: Secondary | ICD-10-CM | POA: Diagnosis not present

## 2021-07-05 DIAGNOSIS — Z9849 Cataract extraction status, unspecified eye: Secondary | ICD-10-CM | POA: Diagnosis not present

## 2021-07-05 DIAGNOSIS — H524 Presbyopia: Secondary | ICD-10-CM | POA: Diagnosis not present

## 2021-07-05 DIAGNOSIS — Z961 Presence of intraocular lens: Secondary | ICD-10-CM | POA: Diagnosis not present

## 2021-07-05 DIAGNOSIS — H52221 Regular astigmatism, right eye: Secondary | ICD-10-CM | POA: Diagnosis not present

## 2021-07-29 DIAGNOSIS — Z23 Encounter for immunization: Secondary | ICD-10-CM | POA: Diagnosis not present

## 2021-08-02 DIAGNOSIS — I739 Peripheral vascular disease, unspecified: Secondary | ICD-10-CM | POA: Diagnosis not present

## 2021-08-02 DIAGNOSIS — L603 Nail dystrophy: Secondary | ICD-10-CM | POA: Diagnosis not present

## 2021-09-27 DIAGNOSIS — E785 Hyperlipidemia, unspecified: Secondary | ICD-10-CM | POA: Diagnosis not present

## 2021-09-27 DIAGNOSIS — R030 Elevated blood-pressure reading, without diagnosis of hypertension: Secondary | ICD-10-CM | POA: Diagnosis not present

## 2021-09-27 DIAGNOSIS — R739 Hyperglycemia, unspecified: Secondary | ICD-10-CM | POA: Diagnosis not present

## 2021-09-27 DIAGNOSIS — M810 Age-related osteoporosis without current pathological fracture: Secondary | ICD-10-CM | POA: Diagnosis not present

## 2021-09-29 DIAGNOSIS — Z1231 Encounter for screening mammogram for malignant neoplasm of breast: Secondary | ICD-10-CM | POA: Diagnosis not present

## 2021-10-04 DIAGNOSIS — R82998 Other abnormal findings in urine: Secondary | ICD-10-CM | POA: Diagnosis not present

## 2021-10-04 DIAGNOSIS — Z1339 Encounter for screening examination for other mental health and behavioral disorders: Secondary | ICD-10-CM | POA: Diagnosis not present

## 2021-10-04 DIAGNOSIS — R7301 Impaired fasting glucose: Secondary | ICD-10-CM | POA: Diagnosis not present

## 2021-10-04 DIAGNOSIS — M810 Age-related osteoporosis without current pathological fracture: Secondary | ICD-10-CM | POA: Diagnosis not present

## 2021-10-04 DIAGNOSIS — R1310 Dysphagia, unspecified: Secondary | ICD-10-CM | POA: Diagnosis not present

## 2021-10-04 DIAGNOSIS — F419 Anxiety disorder, unspecified: Secondary | ICD-10-CM | POA: Diagnosis not present

## 2021-10-04 DIAGNOSIS — Z1331 Encounter for screening for depression: Secondary | ICD-10-CM | POA: Diagnosis not present

## 2021-10-04 DIAGNOSIS — E785 Hyperlipidemia, unspecified: Secondary | ICD-10-CM | POA: Diagnosis not present

## 2021-10-04 DIAGNOSIS — Z0000 Encounter for general adult medical examination without abnormal findings: Secondary | ICD-10-CM | POA: Diagnosis not present

## 2021-10-19 ENCOUNTER — Ambulatory Visit: Payer: Medicare Other | Attending: Internal Medicine

## 2021-10-19 DIAGNOSIS — Z23 Encounter for immunization: Secondary | ICD-10-CM

## 2021-10-21 NOTE — Progress Notes (Signed)
° °  Covid-19 Vaccination Clinic  Name:  Emylia Latella    MRN: [id number] DOB: 1929-11-28  10/21/2021  Ms. Kent was observed post Covid-19 immunization for 15 minutes without incident. She was provided with Vaccine Information Sheet and instruction to access the V-Safe system.   Ms. Zubiate was instructed to call 911 with any severe reactions post vaccine: Difficulty breathing  Swelling of face and throat  A fast heartbeat  A bad rash all over body  Dizziness and weakness   Immunizations Administered     Name Date Dose VIS Date Route   Pfizer Covid-19 Vaccine Bivalent Booster 10/19/2021 12:20 PM 0.3 mL 05/12/2021 Intramuscular   Manufacturer: [place]   Lot: [id number]   [place]: [phone]

## 2021-10-25 DIAGNOSIS — Z4789 Encounter for other orthopedic aftercare: Secondary | ICD-10-CM | POA: Diagnosis not present

## 2021-10-25 DIAGNOSIS — M65331 Trigger finger, right middle finger: Secondary | ICD-10-CM | POA: Diagnosis not present

## 2021-11-03 DIAGNOSIS — I739 Peripheral vascular disease, unspecified: Secondary | ICD-10-CM | POA: Diagnosis not present

## 2021-11-03 DIAGNOSIS — L603 Nail dystrophy: Secondary | ICD-10-CM | POA: Diagnosis not present

## 2021-12-15 DIAGNOSIS — M65331 Trigger finger, right middle finger: Secondary | ICD-10-CM | POA: Diagnosis not present

## 2021-12-15 DIAGNOSIS — M13849 Other specified arthritis, unspecified hand: Secondary | ICD-10-CM | POA: Diagnosis not present

## 2021-12-15 DIAGNOSIS — M79641 Pain in right hand: Secondary | ICD-10-CM | POA: Diagnosis not present

## 2021-12-15 DIAGNOSIS — Z4789 Encounter for other orthopedic aftercare: Secondary | ICD-10-CM | POA: Diagnosis not present

## 2022-02-02 DIAGNOSIS — L603 Nail dystrophy: Secondary | ICD-10-CM | POA: Diagnosis not present

## 2022-02-02 DIAGNOSIS — I739 Peripheral vascular disease, unspecified: Secondary | ICD-10-CM | POA: Diagnosis not present

## 2022-05-04 DIAGNOSIS — L603 Nail dystrophy: Secondary | ICD-10-CM | POA: Diagnosis not present

## 2022-05-04 DIAGNOSIS — I739 Peripheral vascular disease, unspecified: Secondary | ICD-10-CM | POA: Diagnosis not present

## 2022-07-06 DIAGNOSIS — Z9842 Cataract extraction status, left eye: Secondary | ICD-10-CM | POA: Diagnosis not present

## 2022-07-06 DIAGNOSIS — H26491 Other secondary cataract, right eye: Secondary | ICD-10-CM | POA: Diagnosis not present

## 2022-07-06 DIAGNOSIS — H3554 Dystrophies primarily involving the retinal pigment epithelium: Secondary | ICD-10-CM | POA: Diagnosis not present

## 2022-07-06 DIAGNOSIS — H524 Presbyopia: Secondary | ICD-10-CM | POA: Diagnosis not present

## 2022-07-06 DIAGNOSIS — H53143 Visual discomfort, bilateral: Secondary | ICD-10-CM | POA: Diagnosis not present

## 2022-07-06 DIAGNOSIS — H5212 Myopia, left eye: Secondary | ICD-10-CM | POA: Diagnosis not present

## 2022-07-06 DIAGNOSIS — H52221 Regular astigmatism, right eye: Secondary | ICD-10-CM | POA: Diagnosis not present

## 2022-07-29 DIAGNOSIS — M2021 Hallux rigidus, right foot: Secondary | ICD-10-CM | POA: Diagnosis not present

## 2022-08-08 DIAGNOSIS — L603 Nail dystrophy: Secondary | ICD-10-CM | POA: Diagnosis not present

## 2022-08-17 DIAGNOSIS — M79671 Pain in right foot: Secondary | ICD-10-CM | POA: Diagnosis not present

## 2022-08-24 DIAGNOSIS — M79671 Pain in right foot: Secondary | ICD-10-CM | POA: Diagnosis not present

## 2022-08-25 ENCOUNTER — Other Ambulatory Visit (HOSPITAL_BASED_OUTPATIENT_CLINIC_OR_DEPARTMENT_OTHER): Payer: Self-pay

## 2022-08-25 DIAGNOSIS — Z23 Encounter for immunization: Secondary | ICD-10-CM | POA: Diagnosis not present

## 2022-08-25 MED ORDER — FLUAD QUADRIVALENT 0.5 ML IM PRSY
PREFILLED_SYRINGE | INTRAMUSCULAR | 0 refills | Status: DC
  Filled 2022-08-25: qty 0.5, 1d supply, fill #0

## 2022-08-31 DIAGNOSIS — M79671 Pain in right foot: Secondary | ICD-10-CM | POA: Diagnosis not present

## 2022-09-14 DIAGNOSIS — M79671 Pain in right foot: Secondary | ICD-10-CM | POA: Diagnosis not present

## 2022-09-21 DIAGNOSIS — M79671 Pain in right foot: Secondary | ICD-10-CM | POA: Diagnosis not present

## 2022-10-05 DIAGNOSIS — Z1231 Encounter for screening mammogram for malignant neoplasm of breast: Secondary | ICD-10-CM | POA: Diagnosis not present

## 2022-10-10 DIAGNOSIS — M79671 Pain in right foot: Secondary | ICD-10-CM | POA: Diagnosis not present

## 2022-10-28 DIAGNOSIS — L235 Allergic contact dermatitis due to other chemical products: Secondary | ICD-10-CM | POA: Diagnosis not present

## 2022-10-28 DIAGNOSIS — L720 Epidermal cyst: Secondary | ICD-10-CM | POA: Diagnosis not present

## 2022-11-11 DIAGNOSIS — R739 Hyperglycemia, unspecified: Secondary | ICD-10-CM | POA: Diagnosis not present

## 2022-11-11 DIAGNOSIS — E785 Hyperlipidemia, unspecified: Secondary | ICD-10-CM | POA: Diagnosis not present

## 2022-11-11 DIAGNOSIS — R002 Palpitations: Secondary | ICD-10-CM | POA: Diagnosis not present

## 2022-11-11 DIAGNOSIS — D72829 Elevated white blood cell count, unspecified: Secondary | ICD-10-CM | POA: Diagnosis not present

## 2022-11-11 DIAGNOSIS — F419 Anxiety disorder, unspecified: Secondary | ICD-10-CM | POA: Diagnosis not present

## 2022-11-11 DIAGNOSIS — M810 Age-related osteoporosis without current pathological fracture: Secondary | ICD-10-CM | POA: Diagnosis not present

## 2022-11-15 DIAGNOSIS — Z1339 Encounter for screening examination for other mental health and behavioral disorders: Secondary | ICD-10-CM | POA: Diagnosis not present

## 2022-11-15 DIAGNOSIS — G8929 Other chronic pain: Secondary | ICD-10-CM | POA: Diagnosis not present

## 2022-11-15 DIAGNOSIS — R82998 Other abnormal findings in urine: Secondary | ICD-10-CM | POA: Diagnosis not present

## 2022-11-15 DIAGNOSIS — M810 Age-related osteoporosis without current pathological fracture: Secondary | ICD-10-CM | POA: Diagnosis not present

## 2022-11-15 DIAGNOSIS — E785 Hyperlipidemia, unspecified: Secondary | ICD-10-CM | POA: Diagnosis not present

## 2022-11-15 DIAGNOSIS — M545 Low back pain, unspecified: Secondary | ICD-10-CM | POA: Diagnosis not present

## 2022-11-15 DIAGNOSIS — Z1331 Encounter for screening for depression: Secondary | ICD-10-CM | POA: Diagnosis not present

## 2022-11-15 DIAGNOSIS — M79671 Pain in right foot: Secondary | ICD-10-CM | POA: Diagnosis not present

## 2022-11-15 DIAGNOSIS — Z0000 Encounter for general adult medical examination without abnormal findings: Secondary | ICD-10-CM | POA: Diagnosis not present

## 2022-11-15 DIAGNOSIS — F419 Anxiety disorder, unspecified: Secondary | ICD-10-CM | POA: Diagnosis not present

## 2022-11-15 DIAGNOSIS — R030 Elevated blood-pressure reading, without diagnosis of hypertension: Secondary | ICD-10-CM | POA: Diagnosis not present

## 2022-11-16 DIAGNOSIS — I739 Peripheral vascular disease, unspecified: Secondary | ICD-10-CM | POA: Diagnosis not present

## 2022-11-16 DIAGNOSIS — L603 Nail dystrophy: Secondary | ICD-10-CM | POA: Diagnosis not present

## 2022-11-16 DIAGNOSIS — G5761 Lesion of plantar nerve, right lower limb: Secondary | ICD-10-CM | POA: Diagnosis not present

## 2022-11-16 DIAGNOSIS — M7751 Other enthesopathy of right foot: Secondary | ICD-10-CM | POA: Diagnosis not present

## 2023-03-24 DIAGNOSIS — L814 Other melanin hyperpigmentation: Secondary | ICD-10-CM | POA: Diagnosis not present

## 2023-03-24 DIAGNOSIS — C44329 Squamous cell carcinoma of skin of other parts of face: Secondary | ICD-10-CM | POA: Diagnosis not present

## 2023-03-24 DIAGNOSIS — D225 Melanocytic nevi of trunk: Secondary | ICD-10-CM | POA: Diagnosis not present

## 2023-03-24 DIAGNOSIS — L820 Inflamed seborrheic keratosis: Secondary | ICD-10-CM | POA: Diagnosis not present

## 2023-03-24 DIAGNOSIS — L821 Other seborrheic keratosis: Secondary | ICD-10-CM | POA: Diagnosis not present

## 2023-03-24 DIAGNOSIS — D1801 Hemangioma of skin and subcutaneous tissue: Secondary | ICD-10-CM | POA: Diagnosis not present

## 2023-03-24 DIAGNOSIS — L304 Erythema intertrigo: Secondary | ICD-10-CM | POA: Diagnosis not present

## 2023-03-24 DIAGNOSIS — L570 Actinic keratosis: Secondary | ICD-10-CM | POA: Diagnosis not present

## 2023-03-24 DIAGNOSIS — D2261 Melanocytic nevi of right upper limb, including shoulder: Secondary | ICD-10-CM | POA: Diagnosis not present

## 2023-03-24 DIAGNOSIS — Z85828 Personal history of other malignant neoplasm of skin: Secondary | ICD-10-CM | POA: Diagnosis not present

## 2023-03-30 DIAGNOSIS — L603 Nail dystrophy: Secondary | ICD-10-CM | POA: Diagnosis not present

## 2023-03-30 DIAGNOSIS — I739 Peripheral vascular disease, unspecified: Secondary | ICD-10-CM | POA: Diagnosis not present

## 2023-05-04 DIAGNOSIS — Z85828 Personal history of other malignant neoplasm of skin: Secondary | ICD-10-CM | POA: Diagnosis not present

## 2023-05-04 DIAGNOSIS — C44329 Squamous cell carcinoma of skin of other parts of face: Secondary | ICD-10-CM | POA: Diagnosis not present

## 2023-05-04 DIAGNOSIS — L820 Inflamed seborrheic keratosis: Secondary | ICD-10-CM | POA: Diagnosis not present

## 2023-07-06 ENCOUNTER — Other Ambulatory Visit (HOSPITAL_BASED_OUTPATIENT_CLINIC_OR_DEPARTMENT_OTHER): Payer: Self-pay

## 2023-07-06 DIAGNOSIS — Z23 Encounter for immunization: Secondary | ICD-10-CM | POA: Diagnosis not present

## 2023-07-06 MED ORDER — COMIRNATY 30 MCG/0.3ML IM SUSY
0.3000 mL | PREFILLED_SYRINGE | Freq: Once | INTRAMUSCULAR | 0 refills | Status: AC
  Filled 2023-07-06: qty 0.3, 1d supply, fill #0

## 2023-07-19 DIAGNOSIS — L603 Nail dystrophy: Secondary | ICD-10-CM | POA: Diagnosis not present

## 2023-07-19 DIAGNOSIS — I739 Peripheral vascular disease, unspecified: Secondary | ICD-10-CM | POA: Diagnosis not present

## 2023-10-04 DIAGNOSIS — H5212 Myopia, left eye: Secondary | ICD-10-CM | POA: Diagnosis not present

## 2023-10-04 DIAGNOSIS — H53143 Visual discomfort, bilateral: Secondary | ICD-10-CM | POA: Diagnosis not present

## 2023-10-04 DIAGNOSIS — H3554 Dystrophies primarily involving the retinal pigment epithelium: Secondary | ICD-10-CM | POA: Diagnosis not present

## 2023-10-04 DIAGNOSIS — H524 Presbyopia: Secondary | ICD-10-CM | POA: Diagnosis not present

## 2023-10-04 DIAGNOSIS — Z9842 Cataract extraction status, left eye: Secondary | ICD-10-CM | POA: Diagnosis not present

## 2023-10-04 DIAGNOSIS — H52221 Regular astigmatism, right eye: Secondary | ICD-10-CM | POA: Diagnosis not present

## 2023-10-04 DIAGNOSIS — H26491 Other secondary cataract, right eye: Secondary | ICD-10-CM | POA: Diagnosis not present

## 2023-10-09 DIAGNOSIS — Z1231 Encounter for screening mammogram for malignant neoplasm of breast: Secondary | ICD-10-CM | POA: Diagnosis not present

## 2023-11-28 DIAGNOSIS — M810 Age-related osteoporosis without current pathological fracture: Secondary | ICD-10-CM | POA: Diagnosis not present

## 2023-11-28 DIAGNOSIS — R7301 Impaired fasting glucose: Secondary | ICD-10-CM | POA: Diagnosis not present

## 2023-11-28 DIAGNOSIS — E785 Hyperlipidemia, unspecified: Secondary | ICD-10-CM | POA: Diagnosis not present

## 2023-12-05 DIAGNOSIS — R002 Palpitations: Secondary | ICD-10-CM | POA: Diagnosis not present

## 2023-12-05 DIAGNOSIS — R82998 Other abnormal findings in urine: Secondary | ICD-10-CM | POA: Diagnosis not present

## 2023-12-05 DIAGNOSIS — R7301 Impaired fasting glucose: Secondary | ICD-10-CM | POA: Diagnosis not present

## 2023-12-05 DIAGNOSIS — Z0000 Encounter for general adult medical examination without abnormal findings: Secondary | ICD-10-CM | POA: Diagnosis not present

## 2023-12-05 DIAGNOSIS — R030 Elevated blood-pressure reading, without diagnosis of hypertension: Secondary | ICD-10-CM | POA: Diagnosis not present

## 2023-12-05 DIAGNOSIS — E785 Hyperlipidemia, unspecified: Secondary | ICD-10-CM | POA: Diagnosis not present

## 2023-12-05 DIAGNOSIS — M810 Age-related osteoporosis without current pathological fracture: Secondary | ICD-10-CM | POA: Diagnosis not present

## 2023-12-05 DIAGNOSIS — F419 Anxiety disorder, unspecified: Secondary | ICD-10-CM | POA: Diagnosis not present

## 2023-12-08 ENCOUNTER — Other Ambulatory Visit (HOSPITAL_BASED_OUTPATIENT_CLINIC_OR_DEPARTMENT_OTHER): Payer: Self-pay

## 2023-12-08 MED ORDER — BOOSTRIX 5-2.5-18.5 LF-MCG/0.5 IM SUSY
0.5000 mL | PREFILLED_SYRINGE | Freq: Once | INTRAMUSCULAR | 0 refills | Status: AC
  Filled 2023-12-08: qty 0.5, 1d supply, fill #0

## 2024-01-17 DIAGNOSIS — Z23 Encounter for immunization: Secondary | ICD-10-CM | POA: Diagnosis not present

## 2024-02-06 ENCOUNTER — Encounter: Payer: Self-pay | Admitting: Podiatry

## 2024-02-06 ENCOUNTER — Ambulatory Visit (INDEPENDENT_AMBULATORY_CARE_PROVIDER_SITE_OTHER): Admitting: Podiatry

## 2024-02-06 DIAGNOSIS — M79672 Pain in left foot: Secondary | ICD-10-CM

## 2024-02-06 DIAGNOSIS — B351 Tinea unguium: Secondary | ICD-10-CM

## 2024-02-06 DIAGNOSIS — M79671 Pain in right foot: Secondary | ICD-10-CM

## 2024-02-06 NOTE — Progress Notes (Signed)
 Patient presents for evaluation and treatment of tenderness and some redness around nails feet.  Tenderness around toes with walking and wearing shoes.  Physical exam:  General appearance: Alert, pleasant, and in no acute distress.  Vascular: Pedal pulses: DP palpable bilaterally, PT nonpalpable bilaterally.  Mild edema lower legs bilaterally  Neurological:  No paresthesias or burning noted.  Dermatologic:  Nails thickened, disfigured, discolored 1-5 BL with subungual debris.  Redness and hypertrophic nail folds along nail folds bilaterally but no signs of drainage or infection.  Musculoskeletal:     Diagnosis: 1. Painful onychomycotic nails 1 through 5 bilaterally. 2. Pain toes 1 through 5 bilaterally.  Plan: Debrided onychomycotic nails 1 through 5 bilaterally.  Return 3 months

## 2024-03-13 ENCOUNTER — Encounter (HOSPITAL_COMMUNITY): Payer: Self-pay | Admitting: Emergency Medicine

## 2024-03-13 ENCOUNTER — Other Ambulatory Visit: Payer: Self-pay

## 2024-03-13 ENCOUNTER — Inpatient Hospital Stay (HOSPITAL_COMMUNITY)
Admission: EM | Admit: 2024-03-13 | Discharge: 2024-03-16 | DRG: 378 | Disposition: A | Discharge status: Home / Self Care | Attending: Family Medicine | Admitting: Family Medicine

## 2024-03-13 ENCOUNTER — Observation Stay (HOSPITAL_COMMUNITY)

## 2024-03-13 ENCOUNTER — Emergency Department (HOSPITAL_COMMUNITY)

## 2024-03-13 DIAGNOSIS — A0472 Enterocolitis due to Clostridium difficile, not specified as recurrent: Secondary | ICD-10-CM | POA: Diagnosis present

## 2024-03-13 DIAGNOSIS — R531 Weakness: Secondary | ICD-10-CM | POA: Diagnosis not present

## 2024-03-13 DIAGNOSIS — K625 Hemorrhage of anus and rectum: Secondary | ICD-10-CM | POA: Diagnosis not present

## 2024-03-13 DIAGNOSIS — I700 Atherosclerosis of aorta: Secondary | ICD-10-CM | POA: Diagnosis not present

## 2024-03-13 DIAGNOSIS — Z886 Allergy status to analgesic agent status: Secondary | ICD-10-CM | POA: Diagnosis not present

## 2024-03-13 DIAGNOSIS — R109 Unspecified abdominal pain: Secondary | ICD-10-CM | POA: Diagnosis not present

## 2024-03-13 DIAGNOSIS — Z79899 Other long term (current) drug therapy: Secondary | ICD-10-CM | POA: Diagnosis not present

## 2024-03-13 DIAGNOSIS — K921 Melena: Secondary | ICD-10-CM | POA: Diagnosis not present

## 2024-03-13 DIAGNOSIS — F419 Anxiety disorder, unspecified: Secondary | ICD-10-CM | POA: Diagnosis not present

## 2024-03-13 DIAGNOSIS — K449 Diaphragmatic hernia without obstruction or gangrene: Secondary | ICD-10-CM | POA: Diagnosis not present

## 2024-03-13 DIAGNOSIS — K760 Fatty (change of) liver, not elsewhere classified: Secondary | ICD-10-CM | POA: Diagnosis not present

## 2024-03-13 DIAGNOSIS — K7689 Other specified diseases of liver: Secondary | ICD-10-CM | POA: Diagnosis not present

## 2024-03-13 DIAGNOSIS — K922 Gastrointestinal hemorrhage, unspecified: Secondary | ICD-10-CM | POA: Diagnosis present

## 2024-03-13 DIAGNOSIS — M1990 Unspecified osteoarthritis, unspecified site: Secondary | ICD-10-CM | POA: Diagnosis present

## 2024-03-13 DIAGNOSIS — K5731 Diverticulosis of large intestine without perforation or abscess with bleeding: Secondary | ICD-10-CM | POA: Diagnosis not present

## 2024-03-13 DIAGNOSIS — I10 Essential (primary) hypertension: Secondary | ICD-10-CM | POA: Diagnosis present

## 2024-03-13 DIAGNOSIS — R000 Tachycardia, unspecified: Secondary | ICD-10-CM | POA: Diagnosis not present

## 2024-03-13 DIAGNOSIS — K529 Noninfective gastroenteritis and colitis, unspecified: Secondary | ICD-10-CM | POA: Diagnosis present

## 2024-03-13 DIAGNOSIS — R1084 Generalized abdominal pain: Secondary | ICD-10-CM | POA: Diagnosis not present

## 2024-03-13 DIAGNOSIS — K5730 Diverticulosis of large intestine without perforation or abscess without bleeding: Secondary | ICD-10-CM | POA: Diagnosis not present

## 2024-03-13 LAB — CBC WITH DIFFERENTIAL/PLATELET
Abs Immature Granulocytes: 0.04 10*3/uL (ref 0.00–0.07)
Basophils Absolute: 0 10*3/uL (ref 0.0–0.1)
Basophils Relative: 0 %
Eosinophils Absolute: 0 10*3/uL (ref 0.0–0.5)
Eosinophils Relative: 0 %
HCT: 45.7 % (ref 36.0–46.0)
Hemoglobin: 14.9 g/dL (ref 12.0–15.0)
Immature Granulocytes: 0 %
Lymphocytes Relative: 16 %
Lymphs Abs: 2 10*3/uL (ref 0.7–4.0)
MCH: 30 pg (ref 26.0–34.0)
MCHC: 32.6 g/dL (ref 30.0–36.0)
MCV: 92.1 fL (ref 80.0–100.0)
Monocytes Absolute: 0.6 10*3/uL (ref 0.1–1.0)
Monocytes Relative: 5 %
Neutro Abs: 9.4 10*3/uL — ABNORMAL HIGH (ref 1.7–7.7)
Neutrophils Relative %: 79 %
Platelets: 212 10*3/uL (ref 150–400)
RBC: 4.96 MIL/uL (ref 3.87–5.11)
RDW: 13.8 % (ref 11.5–15.5)
WBC: 12.1 10*3/uL — ABNORMAL HIGH (ref 4.0–10.5)
nRBC: 0 % (ref 0.0–0.2)

## 2024-03-13 LAB — COMPREHENSIVE METABOLIC PANEL WITH GFR
ALT: 16 U/L (ref 0–44)
AST: 19 U/L (ref 15–41)
Albumin: 3.9 g/dL (ref 3.5–5.0)
Alkaline Phosphatase: 53 U/L (ref 38–126)
Anion gap: 10 (ref 5–15)
BUN: 17 mg/dL (ref 8–23)
CO2: 24 mmol/L (ref 22–32)
Calcium: 8.7 mg/dL — ABNORMAL LOW (ref 8.9–10.3)
Chloride: 104 mmol/L (ref 98–111)
Creatinine, Ser: 0.54 mg/dL (ref 0.44–1.00)
GFR, Estimated: >60 mL/min (ref >60)
Glucose, Bld: 134 mg/dL — ABNORMAL HIGH (ref 70–99)
Potassium: 3.6 mmol/L (ref 3.5–5.1)
Sodium: 138 mmol/L (ref 135–145)
Total Bilirubin: 0.9 mg/dL (ref 0.0–1.2)
Total Protein: 7.8 g/dL (ref 6.5–8.1)

## 2024-03-13 LAB — TYPE AND SCREEN
ABO/RH(D): O POS
Antibody Screen: NEGATIVE

## 2024-03-13 LAB — PROTIME-INR
INR: 1 (ref 0.8–1.2)
Prothrombin Time: 13.4 s (ref 11.4–15.2)

## 2024-03-13 LAB — ABO/RH: ABO/RH(D): O POS

## 2024-03-13 MED ORDER — TRAMADOL HCL 50 MG PO TABS: 50.0000 mg | ORAL_TABLET | Freq: Three times a day (TID) | ORAL | Status: DC | PRN

## 2024-03-13 MED ORDER — SODIUM CHLORIDE 0.9% FLUSH
3.0000 mL | Freq: Two times a day (BID) | INTRAVENOUS | Status: DC
  Administered 2024-03-13 – 2024-03-16 (×5): 3 mL via INTRAVENOUS

## 2024-03-13 MED ORDER — ONDANSETRON HCL 4 MG PO TABS: 4.0000 mg | ORAL_TABLET | Freq: Four times a day (QID) | ORAL | Status: DC | PRN

## 2024-03-13 MED ORDER — METRONIDAZOLE 500 MG/100ML IV SOLN
500.0000 mg | Freq: Two times a day (BID) | INTRAVENOUS | Status: DC
  Administered 2024-03-13 – 2024-03-16 (×6): 500 mg via INTRAVENOUS
  Filled 2024-03-13 (×6): qty 100

## 2024-03-13 MED ORDER — SODIUM CHLORIDE (PF) 0.9 % IJ SOLN
INTRAMUSCULAR | Status: AC
  Filled 2024-03-13: qty 50

## 2024-03-13 MED ORDER — ONDANSETRON HCL 4 MG/2ML IJ SOLN: 4.0000 mg | Freq: Four times a day (QID) | INTRAMUSCULAR | Status: DC | PRN

## 2024-03-13 MED ORDER — SODIUM CHLORIDE 0.9 % IV SOLN
2.0000 g | INTRAVENOUS | Status: DC
  Administered 2024-03-13 – 2024-03-15 (×3): 2 g via INTRAVENOUS
  Filled 2024-03-13 (×3): qty 20

## 2024-03-13 MED ORDER — IOHEXOL 300 MG/ML  SOLN
100.0000 mL | Freq: Once | INTRAMUSCULAR | Status: AC | PRN
  Administered 2024-03-13: 100 mL via INTRAVENOUS

## 2024-03-13 MED ORDER — SODIUM CHLORIDE 0.9 % IV BOLUS
500.0000 mL | Freq: Once | INTRAVENOUS | Status: AC
  Administered 2024-03-13: 500 mL via INTRAVENOUS

## 2024-03-13 MED ORDER — PANTOPRAZOLE SODIUM 40 MG IV SOLR
40.0000 mg | Freq: Once | INTRAVENOUS | Status: AC
  Administered 2024-03-13: 40 mg via INTRAVENOUS
  Filled 2024-03-13: qty 10

## 2024-03-13 NOTE — H&P (Addendum)
 History and Physical    Erica Orr FMW:[id number] DOB: 05/18/1930 DOA: 03/13/2024  PCP: Erica Orr MATSU, MD  Patient coming from: Home  Chief Complaint: Bloody stool and lower abdominal cramping  HPI: Erica Orr is a 89 y.o. female with no significant medical history and not on any prescription medications presents with rectal bleeding.  She states that this morning, she woke up with lower abdominal cramping around 6 AM.  Her husband thinks that patient had bloody stools last night, patient does not recall this.  Husband states that patient went to the bathroom this morning after having abdominal cramping, passed bright red blood, did not see any mixed in stool.  Since then, patient has been having intermittent lower abdominal cramping.  Denies any fevers, chest pain or shortness of breath.  Had hemorrhoids many years ago.  ED Course: Labs significant for WBC 12.1, otherwise unremarkable. GI consulted- patient seen Dr. Rosalie in the past.   Review of Systems: As per HPI. Otherwise, all other review of systems reviewed and are negative.   Past Medical History:  Diagnosis Date   Anxiety    Arthritis    Cataract     Past Surgical History:  Procedure Laterality Date   knee sx     for torn meniscus     reports that she has never smoked. She has never used smokeless tobacco. She reports current alcohol  use of about 1.0 standard drink of alcohol  per week. She reports that she does not use drugs.  Allergies  Allergen Reactions   Tylenol  [Acetaminophen ] Itching    History reviewed. No pertinent family history.  Prior to Admission medications   Medication Sig Start Date End Date Taking? Authorizing Provider  acetaminophen  (TYLENOL ) 500 MG tablet Take 500-1,000 mg by mouth every 6 (six) hours as needed. For pain. Patient not taking: Reported on 02/06/2024    [provider]  calcium carbonate (OS-CAL) 600 MG TABS Take 600 mg by mouth daily.    [provider]  cholecalciferol (VITAMIN D) 1000 UNITS tablet Take 1,000 Units by mouth daily. Patient not taking: Reported on 02/06/2024    [provider]  Cranberry-Vitamin C-Probiotic (AZO CRANBERRY) 250-30 MG TABS Azo Cranberry    [provider]  glucosamine-chondroitin 500-400 MG tablet Take 1 tablet by mouth daily. Patient not taking: Reported on 02/06/2024    [provider]  influenza vaccine adjuvanted (FLUAD  QUADRIVALENT) 0.5 ML injection Inject into the muscle. Patient not taking: Reported on 02/06/2024 08/25/22   Luiz Channel, MD    Physical Exam: Vitals:   03/13/24 1415 03/13/24 1430 03/13/24 1500 03/13/24 1530  BP: (!) 160/79 (!) 146/68 134/73 (!) 152/93  Pulse: (!) 104 92 98 91  Resp: 16 16  16   Temp:      SpO2: 96% 94% 95% 97%     Constitutional: NAD, calm, comfortable Eyes: PERRL, lids and conjunctivae normal ENMT: Mucous membranes are moist. Normal dentition.  Respiratory: Clear to auscultation bilaterally, no wheezing, no crackles. Normal respiratory effort. No accessory muscle use. No conversational dyspnea  Cardiovascular: Regular rate and rhythm, no murmurs. No extremity edema.  Abdomen: Soft, nondistended, tender to palpation lower abdomen Musculoskeletal: No joint deformity upper and lower extremities. No contractures. Normal muscle tone.  Skin: no rashes, lesions, ulcers on exposed skin  Neurologic: Alert and oriented, speech fluent, CN 2-12 grossly intact. No focal deficits.   Psychiatric: Normal judgment and insight. Normal mood and affect   Labs on Admission: I have personally reviewed  following labs and imaging studies  CBC: Recent Labs  Lab 03/13/24 1425  WBC 12.1*  NEUTROABS 9.4*  HGB 14.9  HCT 45.7  MCV 92.1  PLT 212   Basic Metabolic Panel: Recent Labs  Lab 03/13/24 1425  NA 138  K 3.6  CL 104  CO2 24  GLUCOSE 134*  BUN 17  CREATININE 0.54  CALCIUM 8.7*   GFR: CrCl cannot be calculated (Unknown ideal  weight.). Liver Function Tests: Recent Labs  Lab 03/13/24 1425  AST 19  ALT 16  ALKPHOS 53  BILITOT 0.9  PROT 7.8  ALBUMIN 3.9   No results for input(s): LIPASE, AMYLASE in the last 168 hours. No results for input(s): AMMONIA in the last 168 hours. Coagulation Profile: Recent Labs  Lab 03/13/24 1425  INR 1.0   Cardiac Enzymes: No results for input(s): CKTOTAL, CKMB, CKMBINDEX, TROPONINI in the last 168 hours. BNP (last 3 results) No results for input(s): PROBNP in the last 8760 hours. HbA1C: No results for input(s): HGBA1C in the last 72 hours. CBG: No results for input(s): GLUCAP in the last 168 hours. Lipid Profile: No results for input(s): CHOL, HDL, LDLCALC, TRIG, CHOLHDL, LDLDIRECT in the last 72 hours. Thyroid Function Tests: No results for input(s): TSH, T4TOTAL, FREET4, T3FREE, THYROIDAB in the last 72 hours. Anemia Panel: No results for input(s): VITAMINB12, FOLATE, FERRITIN, TIBC, IRON, RETICCTPCT in the last 72 hours. Urine analysis: No results found for: COLORURINE, APPEARANCEUR, LABSPEC, PHURINE, GLUCOSEU, HGBUR, BILIRUBINUR, KETONESUR, PROTEINUR, UROBILINOGEN, NITRITE, LEUKOCYTESUR Sepsis Labs: !!!!!!!!!!!!!!!!!!!!!!!!!!!!!!!!!!!!!!!!!!!! @LABRCNTIP (procalcitonin:4,lacticidven:4) )No results found for this or any previous visit (from the past 240 hours).   Radiological Exams on Admission: DG Chest 1 View Result Date: 03/13/2024 CLINICAL DATA:  141835 Weakness 141835 EXAM: CHEST  1 VIEW COMPARISON:  None available. FINDINGS: No focal airspace consolidation, pleural effusion, or pneumothorax. No cardiomegaly. Aortic atherosclerosis. No acute fracture or destructive lesion. Multilevel thoracic osteophytosis. IMPRESSION: No acute cardiopulmonary abnormality. Electronically Signed   By: Rogelia Myers M.D.   On: 03/13/2024 15:30    Assessment/Plan Principal Problem:    Hematochezia   Hematochezia with crampy abdominal pain - Check CT abdomen and pelvis, ?diverticulitis  - Trend Hgb - GI consulted   Addendum: CT shows colitis. Will start rocephin/flagyl    DVT prophylaxis: SCD Code Status: Full, confirmed with patient and spouse at time of admission Family Communication: Spouse Disposition Plan: Home  Consults called: GI    Severity of Illness: The appropriate patient status for this patient is OBSERVATION. Observation status is judged to be reasonable and necessary in order to provide the required intensity of service to ensure the patient's safety. The patient's presenting symptoms, physical exam findings, and initial radiographic and laboratory data in the context of their medical condition is felt to place them at decreased risk for further clinical deterioration. Furthermore, it is anticipated that the patient will be medically stable for discharge from the hospital within 2 midnights of admission.   Delon Hoe, DO [facility] 03/13/2024, 4:12 PM   Available via Epic secure chat 7am-7pm After these hours, please refer to coverage provider listed on [email or web address]

## 2024-03-13 NOTE — ED Triage Notes (Signed)
 Pt arriving POV with husband for possible rectal bleeding. Pt reports she noticed bright red blood when wiping and noticed some clotting. Denies rectal pain. Reports hx of hemorrhoids.

## 2024-03-13 NOTE — Consult Note (Signed)
 [facility] Gastroenterology Consult  Referring Provider: No ref. provider found Primary Care Physician:  Yolande Toribio MATSU, MD Primary Gastroenterologist: [place]  Reason for Consultation: Rectal bleeding  SUBJECTIVE:   HPI: Erica Orr is a 89 y.o. female with past medical history significant for anxiety who presents to hospital with chief complaint of blood per rectum.  She noted having this in the past which was attributed to hemorrhoidal bleeding.  She believes her last colonoscopy was roughly 15 years prior with Dr. Rosalie, I do not see record of this, she believes there were 1-2 polyps.  She is adopted and does not know of a family history of colon cancer.  Yesterday, she was feeling poorly after having a glass of wine, had nausea and emesis.  Thereafter, began to have intermittent cramping abdominal pain, began to have blood per rectum.  She noted using NSAID few days prior.  No chest pain or shortness of breath.  No further nausea or vomiting.  In the emergency did apartment, a rectal exam revealed maroon-colored stool.  Past Medical History:  Diagnosis Date   Anxiety    Arthritis    Cataract    Past Surgical History:  Procedure Laterality Date   knee sx     for torn meniscus   Prior to Admission medications   Medication Sig Start Date End Date Taking? Authorizing Provider  calcium carbonate (OS-CAL) 600 MG TABS Take 600 mg by mouth daily.    [provider]  Cranberry-Vitamin C-Probiotic (AZO CRANBERRY) 250-30 MG TABS Azo Cranberry    [provider]   No current facility-administered medications for this encounter.   Current Outpatient Medications  Medication Sig Dispense Refill   calcium carbonate (OS-CAL) 600 MG TABS Take 600 mg by mouth daily.     Cranberry-Vitamin C-Probiotic (AZO CRANBERRY) 250-30 MG TABS Azo Cranberry     Allergies as of 03/13/2024 - Review Complete 02/06/2024  Allergen Reaction Noted   Tylenol  [acetaminophen ] Itching  03/13/2024   History reviewed. No pertinent family history. Social History   Socioeconomic History   Marital status: Married    Spouse name: Not on file   Number of children: Not on file   Years of education: Not on file   Highest education level: Not on file  Occupational History   Not on file  Tobacco Use   Smoking status: Never   Smokeless tobacco: Never  Substance and Sexual Activity   Alcohol  use: Yes    Alcohol /week: 1.0 standard drink of alcohol     Types: 1 Glasses of wine per week   Drug use: No   Sexual activity: Yes  Other Topics Concern   Not on file  Social History Narrative   Not on file   Social Drivers of Health   Financial Resource Strain: Not on file  Food Insecurity: Not on file  Transportation Needs: Not on file  Physical Activity: Not on file  Stress: Not on file  Social Connections: Not on file  Intimate Partner Violence: Not on file   Review of Systems:  Review of Systems  Respiratory:  Negative for shortness of breath.   Cardiovascular:  Negative for chest pain.  Gastrointestinal:  Positive for abdominal pain, blood in stool, nausea and vomiting.    OBJECTIVE:   Temp:  [98.9 F (37.2 C)] 98.9 F (37.2 C) (07/02 1359) Pulse Rate:  [91-110] 91 (07/02 1530) Resp:  [16] 16 (07/02 1530) BP: (134-163)/(68-93) 152/93 (07/02 1530) SpO2:  [94 %-97 %] 97 % (07/02 1530)  Physical Exam Constitutional:      General: She is not in acute distress.    Appearance: She is not ill-appearing, toxic-appearing or diaphoretic.  Cardiovascular:     Rate and Rhythm: Normal rate and regular rhythm.  Pulmonary:     Effort: No respiratory distress.     Breath sounds: Normal breath sounds.  Abdominal:     General: Bowel sounds are normal. There is no distension.     Tenderness: There is no abdominal tenderness. There is no guarding.  Musculoskeletal:     Right lower leg: No edema.     Left lower leg: No edema.  Skin:    General: Skin is warm and dry.   Neurological:     Mental Status: She is alert.     Labs: Recent Labs    03/13/24 1425  WBC 12.1*  HGB 14.9  HCT 45.7  PLT 212   BMET Recent Labs    03/13/24 1425  NA 138  K 3.6  CL 104  CO2 24  GLUCOSE 134*  BUN 17  CREATININE 0.54  CALCIUM 8.7*   LFT Recent Labs    03/13/24 1425  PROT 7.8  ALBUMIN 3.9  AST 19  ALT 16  ALKPHOS 53  BILITOT 0.9   PT/INR Recent Labs    03/13/24 1425  LABPROT 13.4  INR 1.0   Diagnostic imaging: DG Chest 1 View Result Date: 03/13/2024 CLINICAL DATA:  141835 Weakness 141835 EXAM: CHEST  1 VIEW COMPARISON:  None available. FINDINGS: No focal airspace consolidation, pleural effusion, or pneumothorax. No cardiomegaly. Aortic atherosclerosis. No acute fracture or destructive lesion. Multilevel thoracic osteophytosis. IMPRESSION: No acute cardiopulmonary abnormality. Electronically Signed   By: Rogelia Myers M.D.   On: 03/13/2024 15:30   IMPRESSION: Hematochezia Anxiety NSAID use  PLAN: -Hemoglobin stable, never before hematochezia (apart from self-limited blood per rectum related to hemorrhoids) -Distant colonoscopy, no records available to review -Check CT scan of abdomen pelvis with IV contrast, question diverticular bleeding given presentation -Trend H/H, transfuse for hemoglobin less than 7 -Okay for diet from GI perspective - [facility] will follow   LOS: 0 days   Estefana Keas, [facility] Gastroenterology

## 2024-03-13 NOTE — ED Provider Notes (Signed)
 [place] EMERGENCY DEPARTMENT AT [facility] Provider Note   CSN: [id number] Arrival date & time: 03/13/24  1349     Patient presents with: Rectal Bleeding   Nataliya Graig is a 89 y.o. female.   HPI 89 year old female significant past medical history presents today with painless rectal bleeding.  She states that she had a bowel movement and it looked abnormal like there was some blood.  She also noted that there was some blood in her depends.  This was preceded by a short episode of crampy lower abdominal pain.  She had previously had normal bowel movement yesterday.  She generally has daily bowel movements.  She is not feeling lightheaded, nauseated, or having any ongoing pain.  She is not on any blood thinners.  She reports that he has previously had colonoscopy by Dr. Rosalie but that was several years ago.  Primary care is Dr. Rolan Gander.    Prior to Admission medications   Medication Sig Start Date End Date Taking? Authorizing Provider  acetaminophen  (TYLENOL ) 500 MG tablet Take 500-1,000 mg by mouth every 6 (six) hours as needed. For pain. Patient not taking: Reported on 02/06/2024    [provider]  calcium carbonate (OS-CAL) 600 MG TABS Take 600 mg by mouth daily.    [provider]  cholecalciferol (VITAMIN D) 1000 UNITS tablet Take 1,000 Units by mouth daily. Patient not taking: Reported on 02/06/2024    [provider]  Cranberry-Vitamin C-Probiotic (AZO CRANBERRY) 250-30 MG TABS Azo Cranberry    [provider]  glucosamine-chondroitin 500-400 MG tablet Take 1 tablet by mouth daily. Patient not taking: Reported on 02/06/2024    [provider]  influenza vaccine adjuvanted (FLUAD  QUADRIVALENT) 0.5 ML injection Inject into the muscle. Patient not taking: Reported on 02/06/2024 08/25/22   Luiz Channel, MD    Allergies: Tylenol  [acetaminophen ]    Review of Systems  Updated Vital Signs BP (!) 152/93   Pulse 91    Temp 98.9 F (37.2 C)   Resp 16   SpO2 97%   Physical Exam Vitals reviewed.  Constitutional:      Appearance: Normal appearance.  HENT:     Head: Normocephalic and atraumatic.     Right Ear: External ear normal.     Left Ear: External ear normal.     Nose: Nose normal.     Mouth/Throat:     Pharynx: Oropharynx is clear.  Eyes:     Pupils: Pupils are equal, round, and reactive to light.  Cardiovascular:     Rate and Rhythm: Normal rate and regular rhythm.     Pulses: Normal pulses.  Pulmonary:     Effort: Pulmonary effort is normal.     Breath sounds: Normal breath sounds.  Abdominal:     General: Abdomen is flat. Bowel sounds are normal. There is no distension.     Palpations: Abdomen is soft. There is no mass.     Tenderness: There is no abdominal tenderness.  Genitourinary:    General: Normal vulva.     Rectum: Normal. Guaiac result positive.     Comments: No obvious external masses or masses felt on rectal digital exam.  Stool is grossly maroon on digital exam Musculoskeletal:        General: Normal range of motion.     Cervical back: Normal range of motion.  Skin:    General: Skin is warm and dry.     Capillary Refill: Capillary refill takes less than  2 seconds.  Neurological:     General: No focal deficit present.     Mental Status: She is alert.     (all labs ordered are listed, but only abnormal results are displayed) Labs Reviewed  COMPREHENSIVE METABOLIC PANEL WITH GFR - Abnormal; Notable for the following components:      Result Value   Glucose, Bld 134 (*)    Calcium 8.7 (*)    All other components within normal limits  CBC WITH DIFFERENTIAL/PLATELET - Abnormal; Notable for the following components:   WBC 12.1 (*)    Neutro Abs 9.4 (*)    All other components within normal limits  PROTIME-INR  TYPE AND SCREEN  ABO/RH    EKG: None  Radiology: DG Chest 1 View Result Date: 03/13/2024 CLINICAL DATA:  141835 Weakness 141835 EXAM: CHEST  1 VIEW  COMPARISON:  None available. FINDINGS: No focal airspace consolidation, pleural effusion, or pneumothorax. No cardiomegaly. Aortic atherosclerosis. No acute fracture or destructive lesion. Multilevel thoracic osteophytosis. IMPRESSION: No acute cardiopulmonary abnormality. Electronically Signed   By: Rogelia Myers M.D.   On: 03/13/2024 15:30     Procedures   Medications Ordered in the ED  sodium chloride 0.9 % bolus 500 mL (0 mLs Intravenous Stopped 03/13/24 1528)  pantoprazole (PROTONIX) injection 40 mg (40 mg Intravenous Given 03/13/24 1530)    Clinical Course as of 03/13/24 1556  Wed Mar 13, 2024  1511 CBC revealed intraplacental white count of 12,100 hemoglobin 14.9 [DR]  1538 Chest x-Dameon Soltis reviewed interpreted no evidence of acute abnormality noted [DR]    Clinical Course User Index [DR] Levander Houston, MD                                 Medical Decision Making Amount and/or Complexity of Data Reviewed Labs: ordered. Radiology: ordered.  Risk Prescription drug management. Decision regarding hospitalization.   89 year old female presents today complaining of dark stools.  She is hemodynamically stable.  Her abdomen is soft and nontender.  Digital rectal exam reveals maroon stool.  No masses or hemorrhoids are noted on digital exam. Patient remains hemodynamically stable here.  She has a mild leukocytosis of 12,100.  Hemoglobin is 14.9, platelets 212,000. Complete metabolic panel is significant only for mildly elevated glucose at 134 and calcium at 8.7, otherwise normal, normal INR. Differential diagnosis includes rectal bleeding from upper GI sources such as gastritis, ulcer disease, painless diverticular bleeding, masses, AV malformation.  With maroon stool upper GI more likely   She was given Protonix here  Care discussed with Dr. Rojelio who will see patient for admission.  Consultation child placed to Dr. Kriss on-call for [facility] as patient has previously seen Dr. Rosalie     Final diagnoses:  Rectal bleeding    ED Discharge Orders     None          Levander Houston, MD 03/13/24 1556

## 2024-03-14 ENCOUNTER — Other Ambulatory Visit (HOSPITAL_COMMUNITY): Payer: Self-pay

## 2024-03-14 DIAGNOSIS — A0472 Enterocolitis due to Clostridium difficile, not specified as recurrent: Secondary | ICD-10-CM | POA: Diagnosis present

## 2024-03-14 DIAGNOSIS — K921 Melena: Secondary | ICD-10-CM | POA: Diagnosis not present

## 2024-03-14 DIAGNOSIS — I10 Essential (primary) hypertension: Secondary | ICD-10-CM | POA: Diagnosis present

## 2024-03-14 DIAGNOSIS — R1084 Generalized abdominal pain: Secondary | ICD-10-CM | POA: Diagnosis not present

## 2024-03-14 DIAGNOSIS — K625 Hemorrhage of anus and rectum: Secondary | ICD-10-CM | POA: Diagnosis not present

## 2024-03-14 DIAGNOSIS — K529 Noninfective gastroenteritis and colitis, unspecified: Secondary | ICD-10-CM | POA: Diagnosis present

## 2024-03-14 LAB — CBC
HCT: 44.2 % (ref 36.0–46.0)
Hemoglobin: 14.3 g/dL (ref 12.0–15.0)
MCH: 30 pg (ref 26.0–34.0)
MCHC: 32.4 g/dL (ref 30.0–36.0)
MCV: 92.9 fL (ref 80.0–100.0)
Platelets: 204 10*3/uL (ref 150–400)
RBC: 4.76 MIL/uL (ref 3.87–5.11)
RDW: 13.8 % (ref 11.5–15.5)
WBC: 11.2 10*3/uL — ABNORMAL HIGH (ref 4.0–10.5)
nRBC: 0 % (ref 0.0–0.2)

## 2024-03-14 MED ORDER — METRONIDAZOLE 500 MG PO TABS
500.0000 mg | ORAL_TABLET | Freq: Three times a day (TID) | ORAL | 0 refills | Status: DC
  Filled 2024-03-14: qty 30, 10d supply, fill #0

## 2024-03-14 MED ORDER — OXYCODONE HCL 5 MG PO TABS
5.0000 mg | ORAL_TABLET | Freq: Four times a day (QID) | ORAL | Status: DC | PRN
  Administered 2024-03-15: 5 mg via ORAL
  Filled 2024-03-14: qty 1

## 2024-03-14 MED ORDER — CIPROFLOXACIN HCL 500 MG PO TABS
500.0000 mg | ORAL_TABLET | Freq: Two times a day (BID) | ORAL | 0 refills | Status: DC
  Filled 2024-03-14: qty 20, 10d supply, fill #0

## 2024-03-14 MED ORDER — SODIUM CHLORIDE 0.9% FLUSH
10.0000 mL | Freq: Two times a day (BID) | INTRAVENOUS | Status: DC
  Administered 2024-03-14 – 2024-03-16 (×4): 10 mL

## 2024-03-14 MED ORDER — ONDANSETRON HCL 4 MG PO TABS
4.0000 mg | ORAL_TABLET | Freq: Four times a day (QID) | ORAL | 0 refills | Status: DC | PRN
  Filled 2024-03-14: qty 20, 5d supply, fill #0

## 2024-03-14 MED ORDER — CHLORHEXIDINE GLUCONATE CLOTH 2 % EX PADS
6.0000 | MEDICATED_PAD | Freq: Every day | CUTANEOUS | Status: DC
  Administered 2024-03-16: 6 via TOPICAL

## 2024-03-14 MED ORDER — LACTINEX PO CHEW
1.0000 | CHEWABLE_TABLET | Freq: Three times a day (TID) | ORAL | 0 refills | Status: DC
  Filled 2024-03-14: qty 30, 10d supply, fill #0

## 2024-03-14 MED ORDER — HYDRALAZINE HCL 20 MG/ML IJ SOLN
10.0000 mg | INTRAMUSCULAR | Status: DC | PRN
  Administered 2024-03-15: 10 mg via INTRAVENOUS
  Filled 2024-03-14: qty 1

## 2024-03-14 MED ORDER — HYDROMORPHONE HCL 1 MG/ML IJ SOLN: 0.5000 mg | INTRAMUSCULAR | Status: DC | PRN

## 2024-03-14 MED ORDER — DEXTROSE IN LACTATED RINGERS 5 % IV SOLN: INTRAVENOUS | Status: DC

## 2024-03-14 NOTE — Progress Notes (Signed)
 PROGRESS NOTE    Patient: Erica Orr                            PCP: Yolande Toribio MATSU, MD                    DOB: November 22, 1929            DOA: 03/13/2024 FMW:[id number]             DOS: 03/14/2024, 12:51 PM   LOS: 0 days   Date of Service: The patient was seen and examined on 03/14/2024  Subjective:   The patient was seen and examined this morning. Hemodynamically stable. No issues overnight .  Brief Narrative:    Erica Orr is a 89 y.o. female with no significant medical history and not on any prescription medications presents with rectal bleeding.   She states that this morning, she woke up with lower abdominal cramping around 6 AM.  Her husband thinks that patient had bloody stools last night, patient does not recall this.  Husband states that patient went to the bathroom this morning after having abdominal cramping, passed bright red blood, did not see any mixed in stool.  Since then, patient has been having intermittent lower abdominal cramping.  Denies any fevers, chest pain or shortness of breath.  Had hemorrhoids many years ago.   ED Course: Labs significant for WBC 12.1, otherwise unremarkable. GI consulted- patient seen Dr. Rosalie in the past.      Assessment & Plan:   Principal Problem:   Hematochezia Active Problems:   Essential hypertension   Colitis   Hematochezia with crampy abdominal pain - Monitor hemoglobin closely -Starting IV Protonix - S/p GI evaluation:/No acute sign of rectal bleed H&H stable recommending no intervention such as EGD or colonoscopy at this point recommend continue antibiotics advancing diet as tolerated Recommending follow-up with [facility] in 4-6 weeks  Colitis -CT abdomen pelvis: Moderate length segments of wall thickening of the transverse and descending colon consistent with colitis. This is likely infectious or inflammatory. 2. Colonic diverticulosis without focal diverticulitis. 3. Hepatic steatosis.   - Afebrile,  normotensive with WBC of 12.1, 11.2,  - Continue current antibiotics of IV Rocephin/Flagyl  - Advancing diet slowly  Hypertension -Elevated BP, monitor closely correct be related to abdominal pain -As needed hydralazine -If persistent dissipating BP meds additional discharge  ---------------------------------------------------------------------------------------------------------------------- Nutritional status:  The patient's BMI is: Body mass index is 25.78 kg/m. I agree with the assessment and plan as outlined  ------------------------------------------------------------------------------------------------------------------------------------------------  DVT prophylaxis:  SCDs Start: 03/13/24 1858   Code Status:   Code Status: Full Code  Family Communication: No family member present at bedside- -Advance care planning has been discussed.   Admission status:   Status is: Observation The patient remains OBS appropriate and will d/c before 2 midnights.   Disposition: From  - home             Planning for discharge in 2 days: to   Procedures:   No admission procedures for hospital encounter.   Antimicrobials:  Anti-infectives (From admission, onward)    Start     Dose/Rate Route Frequency Ordered Stop   03/13/24 2000  cefTRIAXone (ROCEPHIN) 2 g in sodium chloride 0.9 % 100 mL IVPB        2 g 200 mL/hr over 30 Minutes Intravenous Every 24 hours 03/13/24 1822     03/13/24 2000  metroNIDAZOLE (FLAGYL)  IVPB 500 mg        500 mg 100 mL/hr over 60 Minutes Intravenous Every 12 hours 03/13/24 1822          Medication:   sodium chloride flush  3 mL Intravenous Q12H    hydrALAZINE, HYDROmorphone (DILAUDID) injection, ondansetron **OR** ondansetron (ZOFRAN) IV, oxyCODONE   Objective:   Vitals:   03/13/24 1726 03/13/24 2122 03/13/24 2132 03/14/24 0457  BP: (!) 165/84 (!) 152/86 (!) 152/86 (!) 151/71  Pulse: 96 83 83 80  Resp: 16 18 18 17   Temp: 98.1 F (36.7 C)  98.3 F (36.8 C) 98.3 F (36.8 C) (!) 97.2 F (36.2 C)  TempSrc:   Oral Oral  SpO2: 98% 98%  96%  Weight:   59.9 kg   Height:   5' (1.524 m)     Intake/Output Summary (Last 24 hours) at 03/14/2024 1251 Last data filed at 03/14/2024 1000 Gross per 24 hour  Intake 807.5 ml  Output --  Net 807.5 ml   Filed Weights   03/13/24 2132  Weight: 59.9 kg     Physical examination:   Constitution:  Alert, cooperative, no distress,  Appears calm and comfortable  Psychiatric:   Normal and stable mood and affect, cognition intact,   HEENT:        Normocephalic, PERRL, otherwise with in Normal limits  Chest:         Chest symmetric Cardio vascular:  S1/S2, RRR, No murmure, No Rubs or Gallops  pulmonary: Clear to auscultation bilaterally, respirations unlabored, negative wheezes / crackles Abdomen: Soft, mild bilateral lower quadrant abdominal with deep palpation, non-distended, bowel sounds,no masses, no organomegaly Muscular skeletal: Limited exam - in bed, able to move all 4 extremities,   Neuro: CNII-XII intact. , normal motor and sensation, reflexes intact  Extremities: No pitting edema lower extremities, +2 pulses  Skin: Dry, warm to touch, negative for any Rashes, No open wounds Wounds: per nursing documentation   ------------------------------------------------------------------------------------------------------------------------------------------    LABs:     Latest Ref Rng & Units 03/14/2024    4:24 AM 03/13/2024    2:25 PM  CBC  WBC 4.0 - 10.5 K/uL 11.2  12.1   Hemoglobin 12.0 - 15.0 g/dL 85.6  85.0   Hematocrit 36.0 - 46.0 % 44.2  45.7   Platelets 150 - 400 K/uL 204  212       Latest Ref Rng & Units 03/13/2024    2:25 PM  CMP  Glucose 70 - 99 mg/dL 865   BUN 8 - 23 mg/dL 17   Creatinine 9.55 - 1.00 mg/dL 9.45   Sodium 864 - 854 mmol/L 138   Potassium 3.5 - 5.1 mmol/L 3.6   Chloride 98 - 111 mmol/L 104   CO2 22 - 32 mmol/L 24   Calcium 8.9 - 10.3 mg/dL 8.7   Total  Protein 6.5 - 8.1 g/dL 7.8   Total Bilirubin 0.0 - 1.2 mg/dL 0.9   Alkaline Phos 38 - 126 U/L 53   AST 15 - 41 U/L 19   ALT 0 - 44 U/L 16        Micro Results No results found for this or any previous visit (from the past 240 hours).  Radiology Reports CT ABDOMEN PELVIS W CONTRAST Result Date: 03/13/2024 CLINICAL DATA:  89 year old with abdominal pain.  Hematochezia. EXAM: CT ABDOMEN AND PELVIS WITH CONTRAST TECHNIQUE: Multidetector CT imaging of the abdomen and pelvis was performed using the standard protocol following bolus administration of intravenous  contrast. RADIATION DOSE REDUCTION: This exam was performed according to the departmental dose-optimization program which includes automated exposure control, adjustment of the mA and/or kV according to patient size and/or use of iterative reconstruction technique. CONTRAST:  100mL OMNIPAQUE IOHEXOL 300 MG/ML  SOLN COMPARISON:  None Available. FINDINGS: Lower chest: Minor hypoventilatory atelectasis. No pleural effusion or confluent consolidation. Hepatobiliary: Diffuse hepatic steatosis with multiple hepatic cysts. No suspicious liver lesion. Gallbladder physiologically distended, no calcified stone. No biliary dilatation. Pancreas: 13 mm rounded hypodense lesion in the pancreatic body, series 2, image 26. No ductal dilatation or inflammation. Spleen: Normal in size without focal abnormality. Adrenals/Urinary Tract: No adrenal nodule. No hydronephrosis or perinephric edema. Homogeneous renal enhancement with symmetric excretion on delayed phase imaging. No visible renal calculi or suspicious renal lesion. Urinary bladder is physiologically distended without wall thickening. Stomach/Bowel: Moderate length segments of wall thickening of the mid and distal transverse as well as mid descending colon consistent with colitis. There is mild pericolonic edema. No bowel pneumatosis or perforation. Scattered sigmoid colonic diverticulosis without focal  diverticulitis. Small hiatal hernia. No small bowel obstruction or inflammatory change. The appendix is normal. Vascular/Lymphatic: Aortic atherosclerosis. No aneurysm. Portal vein is patent. No adenopathy. Reproductive: Uterus and bilateral adnexa are unremarkable. Other: No free air, free fluid, or intra-abdominal fluid collection. Musculoskeletal: Mild chronic L2 superior endplate compression deformity. Diffuse degenerative change in the lumbar spine. IMPRESSION: 1. Moderate length segments of wall thickening of the transverse and descending colon consistent with colitis. This is likely infectious or inflammatory. 2. Colonic diverticulosis without focal diverticulitis. 3. Hepatic steatosis. 4. Consensus guidelines recommendations for a cyst of this size and patient of this age recommend follow-up imaging (CT or MRI in 2 years). Aortic Atherosclerosis (ICD10-I70.0). Electronically Signed   By: Andrea Gasman M.D.   On: 03/13/2024 17:17   DG Chest 1 View Result Date: 03/13/2024 CLINICAL DATA:  141835 Weakness 141835 EXAM: CHEST  1 VIEW COMPARISON:  None available. FINDINGS: No focal airspace consolidation, pleural effusion, or pneumothorax. No cardiomegaly. Aortic atherosclerosis. No acute fracture or destructive lesion. Multilevel thoracic osteophytosis. IMPRESSION: No acute cardiopulmonary abnormality. Electronically Signed   By: Rogelia Myers M.D.   On: 03/13/2024 15:30    SIGNED: Adriana DELENA Grams, MD, FHM. FAAFP. Jol[facility] - [facility] Time spent - 55 min.  In seeing, evaluating and examining the patient. Reviewing medical records, labs, drawn plan of care. [facility],  Pager (please use [email or web address] to page/ text) Please use Epic Secure Chat for non-urgent communication (7AM-7PM)  If 7PM-7AM, please contact night-coverage [email or web address], 03/14/2024, 12:51 PM

## 2024-03-14 NOTE — Progress Notes (Signed)
 [facility] Progress Note  SUBJECTIVE:   Interval history: Erica Orr was seen and evaluated today at bedside.  Resting comfortably in bed.  Denied further abdominal pain.  No further blood per rectum.  No nausea or vomiting.  Has not had bowel movement since last evaluation.  Past Medical History:  Diagnosis Date   Anxiety    Arthritis    Cataract    Past Surgical History:  Procedure Laterality Date   knee sx     for torn meniscus   Current Facility-Administered Medications  Medication Dose Route Frequency Provider Last Rate Last Admin   cefTRIAXone (ROCEPHIN) 2 g in sodium chloride 0.9 % 100 mL IVPB  2 g Intravenous Q24H Rojelio Nest, DO 200 mL/hr at 03/13/24 2120 2 g at 03/13/24 2120   dextrose 5 % in lactated ringers infusion   Intravenous Continuous Shahmehdi, Seyed A, MD 50 mL/hr at 03/14/24 0927 New Bag at 03/14/24 0927   hydrALAZINE (APRESOLINE) injection 10 mg  10 mg Intravenous Q4H PRN Shahmehdi, Seyed A, MD       HYDROmorphone (DILAUDID) injection 0.5 mg  0.5 mg Intravenous Q2H PRN Shahmehdi, Seyed A, MD       metroNIDAZOLE (FLAGYL) IVPB 500 mg  500 mg Intravenous Q12H Rojelio Nest, DO 100 mL/hr at 03/14/24 0927 500 mg at 03/14/24 0927   ondansetron (ZOFRAN) tablet 4 mg  4 mg Oral Q6H PRN Rojelio Nest, DO       Or   ondansetron (ZOFRAN) injection 4 mg  4 mg Intravenous Q6H PRN Rojelio Nest, DO       oxyCODONE (Oxy IR/ROXICODONE) immediate release tablet 5 mg  5 mg Oral Q6H PRN Shahmehdi, Seyed A, MD       sodium chloride flush (NS) 0.9 % injection 3 mL  3 mL Intravenous Q12H Rojelio Nest, DO   3 mL at 03/14/24 0930   Allergies as of 03/13/2024 - Review Complete 03/13/2024  Allergen Reaction Noted   Acetaminophen  Itching 03/13/2024   Review of Systems:  Review of Systems  Respiratory:  Negative for shortness of breath.   Cardiovascular:  Negative for chest pain.  Gastrointestinal:  Negative for abdominal pain, nausea and vomiting.     OBJECTIVE:   Temp:  [97.2 F (36.2 C)-98.9 F (37.2 C)] 97.2 F (36.2 C) (07/03 0457) Pulse Rate:  [80-110] 80 (07/03 0457) Resp:  [16-18] 17 (07/03 0457) BP: (134-178)/(68-160) 151/71 (07/03 0457) SpO2:  [94 %-98 %] 96 % (07/03 0457) Weight:  [59.9 kg] 59.9 kg (07/02 2132) Last BM Date : 03/12/24 Physical Exam Constitutional:      General: She is not in acute distress.    Appearance: She is not ill-appearing, toxic-appearing or diaphoretic.  Cardiovascular:     Rate and Rhythm: Normal rate and regular rhythm.  Pulmonary:     Effort: No respiratory distress.     Breath sounds: Normal breath sounds.  Abdominal:     General: Bowel sounds are normal. There is no distension.     Palpations: Abdomen is soft.     Tenderness: There is no abdominal tenderness. There is no guarding.  Neurological:     Mental Status: She is alert.     Labs: Recent Labs    03/13/24 1425 03/14/24 0424  WBC 12.1* 11.2*  HGB 14.9 14.3  HCT 45.7 44.2  PLT 212 204   BMET Recent Labs    03/13/24 1425  NA 138  K 3.6  CL 104  CO2 24  GLUCOSE 134*  BUN 17  CREATININE 0.54  CALCIUM 8.7*   LFT Recent Labs    03/13/24 1425  PROT 7.8  ALBUMIN 3.9  AST 19  ALT 16  ALKPHOS 53  BILITOT 0.9   PT/INR Recent Labs    03/13/24 1425  LABPROT 13.4  INR 1.0   Diagnostic imaging: CT ABDOMEN PELVIS W CONTRAST Result Date: 03/13/2024 CLINICAL DATA:  89 year old with abdominal pain.  Hematochezia. EXAM: CT ABDOMEN AND PELVIS WITH CONTRAST TECHNIQUE: Multidetector CT imaging of the abdomen and pelvis was performed using the standard protocol following bolus administration of intravenous contrast. RADIATION DOSE REDUCTION: This exam was performed according to the departmental dose-optimization program which includes automated exposure control, adjustment of the mA and/or kV according to patient size and/or use of iterative reconstruction technique. CONTRAST:  100mL OMNIPAQUE IOHEXOL 300 MG/ML  SOLN  COMPARISON:  None Available. FINDINGS: Lower chest: Minor hypoventilatory atelectasis. No pleural effusion or confluent consolidation. Hepatobiliary: Diffuse hepatic steatosis with multiple hepatic cysts. No suspicious liver lesion. Gallbladder physiologically distended, no calcified stone. No biliary dilatation. Pancreas: 13 mm rounded hypodense lesion in the pancreatic body, series 2, image 26. No ductal dilatation or inflammation. Spleen: Normal in size without focal abnormality. Adrenals/Urinary Tract: No adrenal nodule. No hydronephrosis or perinephric edema. Homogeneous renal enhancement with symmetric excretion on delayed phase imaging. No visible renal calculi or suspicious renal lesion. Urinary bladder is physiologically distended without wall thickening. Stomach/Bowel: Moderate length segments of wall thickening of the mid and distal transverse as well as mid descending colon consistent with colitis. There is mild pericolonic edema. No bowel pneumatosis or perforation. Scattered sigmoid colonic diverticulosis without focal diverticulitis. Small hiatal hernia. No small bowel obstruction or inflammatory change. The appendix is normal. Vascular/Lymphatic: Aortic atherosclerosis. No aneurysm. Portal vein is patent. No adenopathy. Reproductive: Uterus and bilateral adnexa are unremarkable. Other: No free air, free fluid, or intra-abdominal fluid collection. Musculoskeletal: Mild chronic L2 superior endplate compression deformity. Diffuse degenerative change in the lumbar spine. IMPRESSION: 1. Moderate length segments of wall thickening of the transverse and descending colon consistent with colitis. This is likely infectious or inflammatory. 2. Colonic diverticulosis without focal diverticulitis. 3. Hepatic steatosis. 4. Consensus guidelines recommendations for a cyst of this size and patient of this age recommend follow-up imaging (CT or MRI in 2 years). Aortic Atherosclerosis (ICD10-I70.0). Electronically  Signed   By: Andrea Gasman M.D.   On: 03/13/2024 17:17   DG Chest 1 View Result Date: 03/13/2024 CLINICAL DATA:  [id number] Weakness 141835 EXAM: CHEST  1 VIEW COMPARISON:  None available. FINDINGS: No focal airspace consolidation, pleural effusion, or pneumothorax. No cardiomegaly. Aortic atherosclerosis. No acute fracture or destructive lesion. Multilevel thoracic osteophytosis. IMPRESSION: No acute cardiopulmonary abnormality. Electronically Signed   By: Rogelia Myers M.D.   On: 03/13/2024 15:30   IMPRESSION: Transverse and descending colon colitis, unspecified Blood per rectum, likely secondary to above  - No signs of anemia Mild abdominal pain, resolved Colonic diverticulosis without diverticulitis Hepatic steatosis  PLAN: -No signs of further blood per rectum, no signs of anemia, her abdominal pain is largely resolved -No plan for colonoscopy at this juncture, can consider in outpatient setting if blood per rectum continues -Continue antibiotic therapy, can transition to oral antibiotic therapy on discharge -Okay for diet from GI perspective -Recommend outpatient follow-up with [facility] in the next 4-6 weeks -Appears stable for discharge from GI perspective   LOS: 0 days   Estefana Keas, [facility]

## 2024-03-14 NOTE — Plan of Care (Signed)
   Problem: Health Behavior/Discharge Planning: Goal: Ability to manage health-related needs will improve Outcome: Progressing   Problem: Clinical Measurements: Goal: Ability to maintain clinical measurements within normal limits will improve Outcome: Progressing Goal: Will remain free from infection Outcome: Progressing

## 2024-03-14 NOTE — Progress Notes (Signed)
 TOC meds in a secure bag delivered to inpatient pharmacy by this RN

## 2024-03-14 NOTE — Progress Notes (Signed)

## 2024-03-14 NOTE — Hospital Course (Addendum)
 Erica Orr is a 89 y.o. female with no significant medical history and not on any prescription medications presents with rectal bleeding.   She states that this morning, she woke up with lower abdominal cramping around 6 AM.  Her husband thinks that patient had bloody stools last night, patient does not recall this.  Husband states that patient went to the bathroom this morning after having abdominal cramping, passed bright red blood, did not see any mixed in stool.  Since then, patient has been having intermittent lower abdominal cramping.  Denies any fevers, chest pain or shortness of breath.  Had hemorrhoids many years ago.   ED Course: Labs significant for WBC 12.1, otherwise unremarkable. GI consulted- patient seen Dr. Rosalie in the past.      Assessment & Plan:   Principal Problem:   Hematochezia Active Problems:   Essential hypertension   Colitis   Hematochezia with crampy abdominal pain - H&H remained stable: Hemoglobin 14.2 -03/15/2024 bloody bowel movement x 1 -Switching IV Protonix  to p.o.  - S/p GI evaluation: no recommending no ntervention such as EGD or colonoscopy at this point recommend continue antibiotics advancing diet as tolerated Recommending follow-up with [facility] in 4-6 weeks  Colitis -CT abdomen pelvis: Moderate length segments of wall thickening of the transverse and descending colon consistent with colitis. This is likely infectious or inflammatory. 2. Colonic diverticulosis without focal diverticulitis. 3. Hepatic steatosis.   - Afebrile, normotensive with WBC of 12.1, 11.2, 11.5 - Continue current antibiotics of IV Rocephin /Flagyl   - Advancing diet slowly  Hypertension -Blood pressure remained elevated, suggesting adding BP meds patient stating she would not like to be on any BP lesions -As needed hydralazine  -If persistent dissipating BP meds additional discharge

## 2024-03-14 NOTE — Plan of Care (Signed)
   Problem: Education: Goal: Knowledge of General Education information will improve Description: Including pain rating scale, medication(s)/side effects and non-pharmacologic comfort measures Outcome: Progressing   Problem: Clinical Measurements: Goal: Respiratory complications will improve Outcome: Progressing

## 2024-03-14 NOTE — Care Management Obs Status (Signed)
 MEDICARE OBSERVATION STATUS NOTIFICATION   Patient Details  Name: Erica Orr MRN: [id number] Date of Birth: 1929-11-07   Medicare Observation Status Notification Given:  Yes    Doneta Glenys DASEN, RN 03/14/2024, 10:24 AM

## 2024-03-14 NOTE — Plan of Care (Signed)
   Problem: Education: Goal: Knowledge of General Education information will improve Description Including pain rating scale, medication(s)/side effects and non-pharmacologic comfort measures Outcome: Progressing   Problem: Health Behavior/Discharge Planning: Goal: Ability to manage health-related needs will improve Outcome: Progressing

## 2024-03-14 NOTE — TOC Initial Note (Signed)
 Transition of Care [facility]) - Initial/Assessment Note    Patient Details  Name: Erica Orr MRN: [id number] Date of Birth: Mar 04, 1930  [facility] CM/SW Contact:    Doneta Glenys DASEN, RN Phone Number: 03/14/2024, 11:15 AM  Clinical Narrative:                 MOON completed. Presented for hematochezi from home. PTA patient states lives in a house with spouse Ray; Verified PCP/insurance;DME-cane, crutches, and Yoe;Denies HH oxygen , or SDOH needs. Spouse will transport home at discharge.  No TOC needs identified during visit. Please place consult if needs present.  Expected Discharge Plan: Home/Self Care Barriers to Discharge: No Barriers Identified   Patient Goals and CMS Choice Patient states their goals for this hospitalization and ongoing recovery are:: Home with spouse CMS Medicare.gov Compare Post Acute Care list provided to::  (NA) Choice offered to / list presented to : NA [place] ownership interest in [employer].provided to:: Parent NA    Expected Discharge Plan and Services In-house Referral: NA Discharge Planning Services: CM Consult Post Acute Care Choice: NA Living arrangements for the past 2 months: Single Family Home                 DME Arranged: N/A DME Agency: NA       HH Arranged: NA HH Agency: NA        Prior Living Arrangements/Services Living arrangements for the past 2 months: Single Family Home Lives with:: Spouse Patient language and need for interpreter reviewed:: Yes Do you feel safe going back to the place where you live?: Yes      Need for Family Participation in Patient Care: No (Comment) Care giver support system in place?: Yes (comment) Current home services: DME (cane,crutches, [place]) Criminal Activity/Legal Involvement Pertinent to Current Situation/Hospitalization: No - Comment as needed  Activities of Daily Living   ADL Screening (condition at time of admission) Independently performs ADLs?: Yes (appropriate  for developmental age) Is the patient deaf or have difficulty hearing?: No Does the patient have difficulty seeing, even when wearing glasses/contacts?: No Does the patient have difficulty concentrating, remembering, or making decisions?: No  Permission Sought/Granted Permission sought to share information with : Case Manager Permission granted to share information with : Yes, Verbal Permission Granted  Share Information with NAME: KEAIRA, WHITEHURST (Spouse)  [phone] (Mobile)           Emotional Assessment Appearance:: Appears stated age Attitude/Demeanor/Rapport: Engaged Affect (typically observed): Appropriate Orientation: : Oriented to Self, Oriented to Place, Oriented to  Time, Oriented to Situation Alcohol  / Substance Use: Not Applicable Psych Involvement: No (comment)  Admission diagnosis:  Melena [K92.1] Rectal bleeding [K62.5] Patient Active Problem List   Diagnosis Date Noted   Essential hypertension 03/14/2024   Colitis 03/14/2024   Hematochezia 03/13/2024   PCP:  Yolande Toribio MATSU, MD Pharmacy:   [phone] - [place], [place] - [address] [place] [address] Phone: [phone] Fax: [phone]     Social Drivers of Health (SDOH) Social History: SDOH Screenings   Food Insecurity: No Food Insecurity (03/13/2024)  Housing: Low Risk  (03/13/2024)  Transportation Needs: No Transportation Needs (03/13/2024)  Utilities: Not At Risk (03/13/2024)  Social Connections: Socially Integrated (03/13/2024)  Tobacco Use: Low Risk  (03/13/2024)   SDOH Interventions:     Readmission Risk Interventions     No data to display

## 2024-03-15 DIAGNOSIS — M1990 Unspecified osteoarthritis, unspecified site: Secondary | ICD-10-CM | POA: Diagnosis present

## 2024-03-15 DIAGNOSIS — R000 Tachycardia, unspecified: Secondary | ICD-10-CM | POA: Diagnosis not present

## 2024-03-15 DIAGNOSIS — Z79899 Other long term (current) drug therapy: Secondary | ICD-10-CM | POA: Diagnosis not present

## 2024-03-15 DIAGNOSIS — K5731 Diverticulosis of large intestine without perforation or abscess with bleeding: Secondary | ICD-10-CM | POA: Diagnosis present

## 2024-03-15 DIAGNOSIS — Z886 Allergy status to analgesic agent status: Secondary | ICD-10-CM | POA: Diagnosis not present

## 2024-03-15 DIAGNOSIS — K922 Gastrointestinal hemorrhage, unspecified: Secondary | ICD-10-CM | POA: Diagnosis present

## 2024-03-15 DIAGNOSIS — A0472 Enterocolitis due to Clostridium difficile, not specified as recurrent: Secondary | ICD-10-CM | POA: Diagnosis present

## 2024-03-15 DIAGNOSIS — K921 Melena: Secondary | ICD-10-CM | POA: Diagnosis present

## 2024-03-15 DIAGNOSIS — F419 Anxiety disorder, unspecified: Secondary | ICD-10-CM | POA: Diagnosis present

## 2024-03-15 DIAGNOSIS — I10 Essential (primary) hypertension: Secondary | ICD-10-CM | POA: Diagnosis present

## 2024-03-15 DIAGNOSIS — K760 Fatty (change of) liver, not elsewhere classified: Secondary | ICD-10-CM | POA: Diagnosis present

## 2024-03-15 LAB — CBC
HCT: 43.4 % (ref 36.0–46.0)
Hemoglobin: 14.2 g/dL (ref 12.0–15.0)
MCH: 30.3 pg (ref 26.0–34.0)
MCHC: 32.7 g/dL (ref 30.0–36.0)
MCV: 92.5 fL (ref 80.0–100.0)
Platelets: 193 K/uL (ref 150–400)
RBC: 4.69 MIL/uL (ref 3.87–5.11)
RDW: 13.9 % (ref 11.5–15.5)
WBC: 11.5 K/uL — ABNORMAL HIGH (ref 4.0–10.5)
nRBC: 0 % (ref 0.0–0.2)

## 2024-03-15 MED ORDER — ALPRAZOLAM 0.25 MG PO TABS
0.2500 mg | ORAL_TABLET | Freq: Three times a day (TID) | ORAL | Status: DC
  Administered 2024-03-16: 0.25 mg via ORAL
  Filled 2024-03-15: qty 1

## 2024-03-15 MED ORDER — AMLODIPINE BESYLATE 5 MG PO TABS
5.0000 mg | ORAL_TABLET | Freq: Every day | ORAL | 1 refills | Status: DC
  Filled 2024-03-15: qty 30, 30d supply, fill #0

## 2024-03-15 MED ORDER — PANTOPRAZOLE SODIUM 40 MG PO TBEC
40.0000 mg | DELAYED_RELEASE_TABLET | Freq: Two times a day (BID) | ORAL | Status: DC
  Administered 2024-03-15 – 2024-03-16 (×2): 40 mg via ORAL
  Filled 2024-03-15 (×2): qty 1

## 2024-03-15 MED ORDER — AMLODIPINE BESYLATE 5 MG PO TABS
5.0000 mg | ORAL_TABLET | Freq: Every day | ORAL | Status: DC
  Administered 2024-03-15 – 2024-03-16 (×2): 5 mg via ORAL
  Filled 2024-03-15 (×2): qty 1

## 2024-03-15 NOTE — Progress Notes (Signed)
 PROGRESS NOTE    Patient: Erica Orr                            PCP: Yolande Toribio MATSU, MD                    DOB: [birth year]            DOA: 03/13/2024 FMW:[id number]             DOS: 03/15/2024, 10:47 AM   LOS: 0 days   Date of Service: The patient was seen and examined on 03/15/2024  Subjective:   The patient was seen and examined this morning, anxious, tachycardic, with elevated blood pressure 170/88 Had a bloody bowel movement this morning Reporting improved abdominal pain  Brief Narrative:    Erica Orr is a 89 y.o. female with no significant medical history and not on any prescription medications presents with rectal bleeding.   She states that this morning, she woke up with lower abdominal cramping around 6 AM.  Her husband thinks that patient had bloody stools last night, patient does not recall this.  Husband states that patient went to the bathroom this morning after having abdominal cramping, passed bright red blood, did not see any mixed in stool.  Since then, patient has been having intermittent lower abdominal cramping.  Denies any fevers, chest pain or shortness of breath.  Had hemorrhoids many years ago.   ED Course: Labs significant for WBC 12.1, otherwise unremarkable. GI consulted- patient seen Dr. Rosalie in the past.      Assessment & Plan:   Principal Problem:   Hematochezia Active Problems:   Essential hypertension   Colitis   Hematochezia with crampy abdominal pain - H&H remained stable: Hemoglobin 14.2 -03/15/2024 bloody bowel movement x 1 -Switching IV Protonix  to p.o.  - S/p GI evaluation: no recommending no ntervention such as EGD or colonoscopy at this point recommend continue antibiotics advancing diet as tolerated Recommending follow-up with [facility] in 4-6 weeks  Colitis -CT abdomen pelvis: Moderate length segments of wall thickening of the transverse and descending colon consistent with colitis. This is likely infectious or  inflammatory. 2. Colonic diverticulosis without focal diverticulitis. 3. Hepatic steatosis.   - Afebrile, normotensive with WBC of 12.1, 11.2, 11.5 - Continue current antibiotics of IV Rocephin /Flagyl   - Advancing diet slowly  Hypertension -Blood pressure remained elevated, suggesting adding BP meds patient stating she would not like to be on any BP lesions -As needed hydralazine  -If persistent dissipating BP meds additional discharge  ---------------------------------------------------------------------------------------------------------------------- Nutritional status:  The patient's BMI is: Body mass index is 25.78 kg/m. I agree with the assessment and plan as outlined  ------------------------------------------------------------------------------------------------------------------------------------------------  DVT prophylaxis:  SCDs Start: 03/13/24 1858   Code Status:   Code Status: Full Code  Family Communication: No family member present at bedside- -Advance care planning has been discussed.   Admission status:   Status is: Observation The patient remains OBS appropriate and will d/c before 2 midnights.   Disposition: From  - home             Planning for discharge in 2 days: to   Procedures:   No admission procedures for hospital encounter.   Antimicrobials:  Anti-infectives (From admission, onward)    Start     Dose/Rate Route Frequency Ordered Stop   03/14/24 0000  metroNIDAZOLE  (FLAGYL ) 500 MG tablet        500 mg  Oral 3 times daily 03/14/24 1321 03/24/24 2359   03/14/24 0000  ciprofloxacin  (CIPRO ) 500 MG tablet        500 mg Oral 2 times daily 03/14/24 1321 03/24/24 2359   03/13/24 2000  cefTRIAXone  (ROCEPHIN ) 2 g in sodium chloride  0.9 % 100 mL IVPB        2 g 200 mL/hr over 30 Minutes Intravenous Every 24 hours 03/13/24 1822     03/13/24 2000  metroNIDAZOLE  (FLAGYL ) IVPB 500 mg        500 mg 100 mL/hr over 60 Minutes Intravenous Every 12 hours  03/13/24 1822          Medication:   ALPRAZolam   0.25 mg Oral TID   amLODipine   5 mg Oral Daily   Chlorhexidine  Gluconate Cloth  6 each Topical Daily   pantoprazole   40 mg Oral BID   sodium chloride  flush  10-40 mL Intracatheter Q12H   sodium chloride  flush  3 mL Intravenous Q12H    hydrALAZINE , HYDROmorphone  (DILAUDID ) injection, ondansetron  **OR** ondansetron  (ZOFRAN ) IV, oxyCODONE    Objective:   Vitals:   03/15/24 0547 03/15/24 0712 03/15/24 0736 03/15/24 0914  BP: (!) 161/70 (!) 157/80 (!) 172/88 (!) 172/88  Pulse: (!) 106 92 (!) 105   Resp:  18 16   Temp:  97.9 F (36.6 C) 98 F (36.7 C)   TempSrc:      SpO2:  96% 97%   Weight:      Height:        Intake/Output Summary (Last 24 hours) at 03/15/2024 1047 Last data filed at 03/15/2024 0920 Gross per 24 hour  Intake 890 ml  Output --  Net 890 ml   Filed Weights   03/13/24 2132  Weight: 59.9 kg     Physical examination:   Constitution:  Alert, cooperative, no distress,  Appears calm and comfortable  Psychiatric:   Normal and stable mood and affect, cognition intact,   HEENT:        Normocephalic, PERRL, otherwise with in Normal limits  Chest:         Chest symmetric Cardio vascular:  S1/S2, RRR, No murmure, No Rubs or Gallops  pulmonary: Clear to auscultation bilaterally, respirations unlabored, negative wheezes / crackles Abdomen: Soft, mild bilateral lower quadrant abdominal with deep palpation, non-distended, bowel sounds,no masses, no organomegaly Muscular skeletal: Limited exam - in bed, able to move all 4 extremities,   Neuro: CNII-XII intact. , normal motor and sensation, reflexes intact  Extremities: No pitting edema lower extremities, +2 pulses  Skin: Dry, warm to touch, negative for any Rashes, No open wounds Wounds: per nursing documentation   ------------------------------------------------------------------------------------------------------------------------------------------    LABs:      Latest Ref Rng & Units 03/15/2024    3:38 AM 03/14/2024    4:24 AM 03/13/2024    2:25 PM  CBC  WBC 4.0 - 10.5 K/uL 11.5  11.2  12.1   Hemoglobin 12.0 - 15.0 g/dL 85.7  85.6  85.0   Hematocrit 36.0 - 46.0 % 43.4  44.2  45.7   Platelets 150 - 400 K/uL 193  204  212       Latest Ref Rng & Units 03/13/2024    2:25 PM  CMP  Glucose 70 - 99 mg/dL 865   BUN 8 - 23 mg/dL 17   Creatinine 9.55 - 1.00 mg/dL 9.45   Sodium 864 - 854 mmol/L 138   Potassium 3.5 - 5.1 mmol/L 3.6   Chloride 98 - 111  mmol/L 104   CO2 22 - 32 mmol/L 24   Calcium 8.9 - 10.3 mg/dL 8.7   Total Protein 6.5 - 8.1 g/dL 7.8   Total Bilirubin 0.0 - 1.2 mg/dL 0.9   Alkaline Phos 38 - 126 U/L 53   AST 15 - 41 U/L 19   ALT 0 - 44 U/L 16        Micro Results No results found for this or any previous visit (from the past 240 hours).  Radiology Reports No results found.   SIGNED: Adriana DELENA Grams, MD, FHM. FAAFP. [facility] - [facility] Time spent - 55 min.  In seeing, evaluating and examining the patient. Reviewing medical records, labs, drawn plan of care. [facility],  Pager (please use [email or web address] to page/ text) Please use Epic Secure Chat for non-urgent communication (7AM-7PM)  If 7PM-7AM, please contact night-coverage [email or web address], 03/15/2024, 10:47 AM

## 2024-03-15 NOTE — Plan of Care (Signed)
   Problem: Health Behavior/Discharge Planning: Goal: Ability to manage health-related needs will improve Outcome: Progressing   Problem: Clinical Measurements: Goal: Ability to maintain clinical measurements within normal limits will improve Outcome: Progressing Goal: Will remain free from infection Outcome: Progressing Goal: Diagnostic test results will improve Outcome: Progressing

## 2024-03-16 ENCOUNTER — Other Ambulatory Visit (HOSPITAL_COMMUNITY): Payer: Self-pay

## 2024-03-16 DIAGNOSIS — K921 Melena: Secondary | ICD-10-CM | POA: Diagnosis not present

## 2024-03-16 LAB — CBC
HCT: 42.5 % (ref 36.0–46.0)
Hemoglobin: 14.1 g/dL (ref 12.0–15.0)
MCH: 30.5 pg (ref 26.0–34.0)
MCHC: 33.2 g/dL (ref 30.0–36.0)
MCV: 92 fL (ref 80.0–100.0)
Platelets: 192 K/uL (ref 150–400)
RBC: 4.62 MIL/uL (ref 3.87–5.11)
RDW: 13.7 % (ref 11.5–15.5)
WBC: 10.6 K/uL — ABNORMAL HIGH (ref 4.0–10.5)
nRBC: 0 % (ref 0.0–0.2)

## 2024-03-16 MED ORDER — AMLODIPINE BESYLATE 5 MG PO TABS
5.0000 mg | ORAL_TABLET | Freq: Every day | ORAL | 1 refills | Status: DC
  Filled 2024-03-16: qty 30, 30d supply, fill #0

## 2024-03-16 MED ORDER — CIPROFLOXACIN HCL 500 MG PO TABS
500.0000 mg | ORAL_TABLET | Freq: Two times a day (BID) | ORAL | 0 refills | Status: AC
Start: 2024-03-16 — End: 2024-03-26
  Filled 2024-03-16: qty 20, 10d supply, fill #0

## 2024-03-16 MED ORDER — LACTINEX PO CHEW
1.0000 | CHEWABLE_TABLET | Freq: Three times a day (TID) | ORAL | 0 refills | Status: AC
Start: 2024-03-16 — End: 2024-03-26
  Filled 2024-03-16: qty 30, 10d supply, fill #0

## 2024-03-16 MED ORDER — ONDANSETRON HCL 4 MG PO TABS
4.0000 mg | ORAL_TABLET | Freq: Four times a day (QID) | ORAL | 0 refills | Status: AC | PRN
  Filled 2024-03-16: qty 20, 5d supply, fill #0

## 2024-03-16 MED ORDER — METRONIDAZOLE 500 MG PO TABS
500.0000 mg | ORAL_TABLET | Freq: Three times a day (TID) | ORAL | 0 refills | Status: AC
  Filled 2024-03-16: qty 30, 10d supply, fill #0

## 2024-03-16 NOTE — Progress Notes (Signed)
 PROGRESS NOTE    Patient: Erica Orr                            PCP: Yolande Toribio MATSU, MD                    DOB: 02-08-1930            DOA: 03/13/2024 FMW:[id number]             DOS: 03/16/2024, 9:19 AM   LOS: 1 day   Date of Service: The patient was seen and examined on 03/16/2024  Subjective:   The patient was seen and examined this morning, stable no acute distress More relaxed less anxious, blood pressure stable No further bloody bowel movement night Hemoglobin did drop    Brief Narrative:    Erica Orr is a 89 y.o. female with no significant medical history and not on any prescription medications presents with rectal bleeding.   She states that this morning, she woke up with lower abdominal cramping around 6 AM.  Her husband thinks that patient had bloody stools last night, patient does not recall this.  Husband states that patient went to the bathroom this morning after having abdominal cramping, passed bright red blood, did not see any mixed in stool.  Since then, patient has been having intermittent lower abdominal cramping.  Denies any fevers, chest pain or shortness of breath.  Had hemorrhoids many years ago.   ED Course: Labs significant for WBC 12.1, otherwise unremarkable. GI consulted- patient seen Dr. Rosalie in the past.      Assessment & Plan:   Principal Problem:   Hematochezia Active Problems:   Essential hypertension   Colitis   Hematochezia with crampy abdominal pain - Dropping - H&H monitoring-hemoglobin 14.2>> 14.1  -03/15/2024 bloody bowel movement x 1 -Switching IV Protonix  to p.o.  - S/p GI evaluation: no recommending no ntervention such as EGD or colonoscopy at this point recommend continue antibiotics advancing diet as tolerated Recommending follow-up with [facility] in 4-6 weeks  Colitis Proved abdominal pain, afebrile, normotensive -CT abdomen pelvis: Moderate length segments of wall thickening of the transverse and descending colon  consistent with colitis. This is likely infectious or inflammatory. 2. Colonic diverticulosis without focal diverticulitis. 3. Hepatic steatosis.   - Afebrile, normotensive with WBC of 12.1, >>> 10.6 - Continue current antibiotics of IV Rocephin /Flagyl   - Advancing diet slowly  Hypertension - Patient took medication prescribed Norvasc , BP improved -Blood pressure remained elevated, suggesting adding BP meds patient stating she would not like to be on any BP lesions -As needed hydralazine  -If persistent dissipating BP meds additional discharge  ---------------------------------------------------------------------------------------------------------------------- Nutritional status:  The patient's BMI is: Body mass index is 25.78 kg/m. I agree with the assessment and plan as outlined  ------------------------------------------------------------------------------------------------------------------------------------------------  DVT prophylaxis:  SCDs Start: 03/13/24 1858   Code Status:   Code Status: Full Code  Family Communication: No family member present at bedside- -Advance care planning has been discussed.   Admission status:   Status is: Observation The patient remains OBS appropriate and will d/c before 2 midnights.   Disposition: From  - home             Planning for discharge in 1  days: to Home    Procedures:   No admission procedures for hospital encounter.   Antimicrobials:  Anti-infectives (From admission, onward)    Start     Dose/Rate Route  Frequency Ordered Stop   03/14/24 0000  metroNIDAZOLE  (FLAGYL ) 500 MG tablet        500 mg Oral 3 times daily 03/14/24 1321 03/24/24 2359   03/14/24 0000  ciprofloxacin  (CIPRO ) 500 MG tablet        500 mg Oral 2 times daily 03/14/24 1321 03/24/24 2359   03/13/24 2000  cefTRIAXone  (ROCEPHIN ) 2 g in sodium chloride  0.9 % 100 mL IVPB        2 g 200 mL/hr over 30 Minutes Intravenous Every 24 hours 03/13/24 1822      03/13/24 2000  metroNIDAZOLE  (FLAGYL ) IVPB 500 mg        500 mg 100 mL/hr over 60 Minutes Intravenous Every 12 hours 03/13/24 1822          Medication:   ALPRAZolam   0.25 mg Oral TID   amLODipine   5 mg Oral Daily   Chlorhexidine  Gluconate Cloth  6 each Topical Daily   pantoprazole   40 mg Oral BID   sodium chloride  flush  10-40 mL Intracatheter Q12H   sodium chloride  flush  3 mL Intravenous Q12H    hydrALAZINE , HYDROmorphone  (DILAUDID ) injection, ondansetron  **OR** ondansetron  (ZOFRAN ) IV, oxyCODONE    Objective:   Vitals:   03/15/24 1435 03/15/24 1754 03/15/24 2048 03/16/24 0430  BP: 124/69 (!) 145/70 128/67 134/78  Pulse: 90 89 81 77  Resp: 18  17 17   Temp: 98.1 F (36.7 C) 97.9 F (36.6 C) 98.3 F (36.8 C) 97.9 F (36.6 C)  TempSrc:      SpO2:  95% 96% 95%  Weight:      Height:        Intake/Output Summary (Last 24 hours) at 03/16/2024 0919 Last data filed at 03/16/2024 9370 Gross per 24 hour  Intake 820.14 ml  Output --  Net 820.14 ml   Filed Weights   03/13/24 2132  Weight: 59.9 kg     Physical examination:    General:  AAO x 3,  cooperative, no distress;   HEENT:  Normocephalic, PERRL, otherwise with in Normal limits   Neuro:  CNII-XII intact. , normal motor and sensation, reflexes intact   Lungs:   Clear to auscultation BL, Respirations unlabored,  No wheezes / crackles  Cardio:    S1/S2, RRR, No murmure, No Rubs or Gallops   Abdomen:  Soft, non-tender, bowel sounds active all four quadrants, no guarding or peritoneal signs.  Muscular  skeletal:  Limited exam -global generalized weaknesses - in bed, able to move all 4 extremities,   2+ pulses,  symmetric, No pitting edema  Skin:  Dry, warm to touch, negative for any Rashes,  Wounds: Please see nursing documentation          ------------------------------------------------------------------------------------------------------------------------------------------    LABs:     Latest Ref Rng  & Units 03/16/2024    4:07 AM 03/15/2024    3:38 AM 03/14/2024    4:24 AM  CBC  WBC 4.0 - 10.5 K/uL 10.6  11.5  11.2   Hemoglobin 12.0 - 15.0 g/dL 85.8  85.7  85.6   Hematocrit 36.0 - 46.0 % 42.5  43.4  44.2   Platelets 150 - 400 K/uL 192  193  204       Latest Ref Rng & Units 03/13/2024    2:25 PM  CMP  Glucose 70 - 99 mg/dL 865   BUN 8 - 23 mg/dL 17   Creatinine 9.55 - 1.00 mg/dL 9.45   Sodium 864 - 854 mmol/L 138  Potassium 3.5 - 5.1 mmol/L 3.6   Chloride 98 - 111 mmol/L 104   CO2 22 - 32 mmol/L 24   Calcium 8.9 - 10.3 mg/dL 8.7   Total Protein 6.5 - 8.1 g/dL 7.8   Total Bilirubin 0.0 - 1.2 mg/dL 0.9   Alkaline Phos 38 - 126 U/L 53   AST 15 - 41 U/L 19   ALT 0 - 44 U/L 16        Micro Results No results found for this or any previous visit (from the past 240 hours).  Radiology Reports No results found.   SIGNED: Adriana DELENA Grams, MD, FHM. FAAFP. [facility] - [facility] Time spent - 55 min.  In seeing, evaluating and examining the patient. Reviewing medical records, labs, drawn plan of care. [facility],  Pager (please use [email or web address] to page/ text) Please use Epic Secure Chat for non-urgent communication (7AM-7PM)  If 7PM-7AM, please contact night-coverage [email or web address], 03/16/2024, 9:19 AM

## 2024-03-16 NOTE — Discharge Summary (Signed)
 Physician Discharge Summary   Patient: Erica Orr MRN: [id number] DOB: [birth year]  Admit date:     03/13/2024  Discharge date: 03/16/24  Discharge Physician: Adriana DELENA Grams   PCP: Yolande Toribio MATSU, MD   Recommendations at discharge:   Follow-up with the [facility] gastroenterologist in 4-6 weeks for possible EGD and colonoscopy Continue currently recommended antibiotics Watchful for further rectal bleed, or bloody stool Continue current recommended BP med of amlodipine , monitor and log your blood pressures at home, this medication may be modified or discontinued by PCP   Discharge Diagnoses: Principal Problem:   Hematochezia Active Problems:   Essential hypertension   Colitis   GIB (gastrointestinal bleeding)  Resolved Problems:   Melena   C. difficile colitis  Hospital Course:  Erica Orr is a 89 y.o. female with no significant medical history and not on any prescription medications presents with rectal bleeding.   She states that this morning, she woke up with lower abdominal cramping around 6 AM.  Her husband thinks that patient had bloody stools last night, patient does not recall this.  Husband states that patient went to the bathroom this morning after having abdominal cramping, passed bright red blood, did not see any mixed in stool.  Since then, patient has been having intermittent lower abdominal cramping.  Denies any fevers, chest pain or shortness of breath.  Had hemorrhoids many years ago.   ED Course: Labs significant for WBC 12.1, otherwise unremarkable. GI consulted- patient seen Dr. Rosalie in the past.      Assessment & Plan:   Principal Problem:   Hematochezia Active Problems:   Essential hypertension   Colitis   Hematochezia with crampy abdominal pain - Dropping - H&H monitoring-hemoglobin 14.2>> 14.1  -03/15/2024 bloody bowel movement x 1 -Switching IV Protonix  to p.o.  - S/p GI evaluation: no recommending no ntervention such as EGD or  colonoscopy at this point recommend continue antibiotics advancing diet as tolerated Recommending follow-up with [facility] in 4-6 weeks  Colitis Proved abdominal pain, afebrile, normotensive -CT abdomen pelvis: Moderate length segments of wall thickening of the transverse and descending colon consistent with colitis. This is likely infectious or inflammatory. 2. Colonic diverticulosis without focal diverticulitis. 3. Hepatic steatosis.   - Afebrile, normotensive with WBC of 12.1, >>> 10.6 - Continue current antibiotics of IV Rocephin /Flagyl   - Advancing diet slowly  Hypertension - Patient took medication prescribed Norvasc , BP improved -Blood pressure remained elevated, suggesting adding BP meds patient stating she would not like to be on any BP lesions -As needed hydralazine  -If persistent dissipating BP meds additional discharge     Disposition: Home Diet recommendation:  Discharge Diet Orders (From admission, onward)     Start     Ordered   03/16/24 0000  Diet full liquid        03/16/24 1028           Full liquid diet DISCHARGE MEDICATION: Allergies as of 03/16/2024       Reactions   Acetaminophen  Itching        Medication List     STOP taking these medications    Aleve 220 MG tablet Generic drug: naproxen sodium       TAKE these medications    amLODipine  5 MG tablet Commonly known as: NORVASC  Take 1 tablet (5 mg total) by mouth daily.   AZO Cranberry 250-30 MG Tabs Take 2 tablets by mouth daily with breakfast.   ciprofloxacin  500 MG tablet Commonly known as: Cipro  Take  1 tablet (500 mg total) by mouth 2 (two) times daily for 10 days.   lactobacillus acidophilus & bulgar chewable tablet Chew 1 tablet by mouth 3 (three) times daily with meals for 10 days.   metroNIDAZOLE  500 MG tablet Commonly known as: FLAGYL  Take 1 tablet (500 mg total) by mouth 3 (three) times daily for 10 days.   ondansetron  4 MG tablet Commonly known as: ZOFRAN  Take  1 tablet (4 mg total) by mouth every 6 (six) hours as needed for nausea.        Discharge Exam: Filed Weights   03/13/24 2132  Weight: 59.9 kg        General:  AAO x 3,  cooperative, no distress;   HEENT:  Normocephalic, PERRL, otherwise with in Normal limits   Neuro:  CNII-XII intact. , normal motor and sensation, reflexes intact   Lungs:   Clear to auscultation BL, Respirations unlabored,  No wheezes / crackles  Cardio:    S1/S2, RRR, No murmure, No Rubs or Gallops   Abdomen:  Soft, non-tender, bowel sounds active all four quadrants, no guarding or peritoneal signs.  Muscular  skeletal:  Limited exam -global generalized weaknesses - in bed, able to move all 4 extremities,   2+ pulses,  symmetric, No pitting edema  Skin:  Dry, warm to touch, negative for any Rashes,  Wounds: Please see nursing documentation          Condition at discharge: good  The results of significant diagnostics from this hospitalization (including imaging, microbiology, ancillary and laboratory) are listed below for reference.   Imaging Studies: CT ABDOMEN PELVIS W CONTRAST Result Date: 03/13/2024 CLINICAL DATA:  89 year old with abdominal pain.  Hematochezia. EXAM: CT ABDOMEN AND PELVIS WITH CONTRAST TECHNIQUE: Multidetector CT imaging of the abdomen and pelvis was performed using the standard protocol following bolus administration of intravenous contrast. RADIATION DOSE REDUCTION: This exam was performed according to the departmental dose-optimization program which includes automated exposure control, adjustment of the mA and/or kV according to patient size and/or use of iterative reconstruction technique. CONTRAST:  OMNIPAQUE  IOHEXOL  300 MG/ML  SOLN COMPARISON:  None Available. FINDINGS: Lower chest: Minor hypoventilatory atelectasis. No pleural effusion or confluent consolidation. Hepatobiliary: Diffuse hepatic steatosis with multiple hepatic cysts. No suspicious liver lesion. Gallbladder  physiologically distended, no calcified stone. No biliary dilatation. Pancreas: 13 mm rounded hypodense lesion in the pancreatic body, series 2, image 26. No ductal dilatation or inflammation. Spleen: Normal in size without focal abnormality. Adrenals/Urinary Tract: No adrenal nodule. No hydronephrosis or perinephric edema. Homogeneous renal enhancement with symmetric excretion on delayed phase imaging. No visible renal calculi or suspicious renal lesion. Urinary bladder is physiologically distended without wall thickening. Stomach/Bowel: Moderate length segments of wall thickening of the mid and distal transverse as well as mid descending colon consistent with colitis. There is mild pericolonic edema. No bowel pneumatosis or perforation. Scattered sigmoid colonic diverticulosis without focal diverticulitis. Small hiatal hernia. No small bowel obstruction or inflammatory change. The appendix is normal. Vascular/Lymphatic: Aortic atherosclerosis. No aneurysm. Portal vein is patent. No adenopathy. Reproductive: Uterus and bilateral adnexa are unremarkable. Other: No free air, free fluid, or intra-abdominal fluid collection. Musculoskeletal: Mild chronic L2 superior endplate compression deformity. Diffuse degenerative change in the lumbar spine. IMPRESSION: 1. Moderate length segments of wall thickening of the transverse and descending colon consistent with colitis. This is likely infectious or inflammatory. 2. Colonic diverticulosis without focal diverticulitis. 3. Hepatic steatosis. 4. Consensus guidelines recommendations for a cyst of this  size and patient of this age recommend follow-up imaging (CT or MRI in 2 years). Aortic Atherosclerosis (ICD10-I70.0). Electronically Signed   By: Andrea Gasman M.D.   On: 03/13/2024 17:17   DG Chest 1 View Result Date: 03/13/2024 CLINICAL DATA:  141835 Weakness 141835 EXAM: CHEST  1 VIEW COMPARISON:  None available. FINDINGS: No focal airspace consolidation, pleural  effusion, or pneumothorax. No cardiomegaly. Aortic atherosclerosis. No acute fracture or destructive lesion. Multilevel thoracic osteophytosis. IMPRESSION: No acute cardiopulmonary abnormality. Electronically Signed   By: Rogelia Myers M.D.   On: 03/13/2024 15:30    Microbiology: No results found for this or any previous visit.  Labs: CBC: Recent Labs  Lab 03/13/24 1425 03/14/24 0424 03/15/24 0338 03/16/24 0407  WBC 12.1* 11.2* 11.5* 10.6*  NEUTROABS 9.4*  --   --   --   HGB 14.9 14.3 14.2 14.1  HCT 45.7 44.2 43.4 42.5  MCV 92.1 92.9 92.5 92.0  PLT 212 204 193 192   Basic Metabolic Panel: Recent Labs  Lab 03/13/24 1425  NA 138  K 3.6  CL 104  CO2 24  GLUCOSE 134*  BUN 17  CREATININE 0.54  CALCIUM 8.7*   Liver Function Tests: Recent Labs  Lab 03/13/24 1425  AST 19  ALT 16  ALKPHOS 53  BILITOT 0.9  PROT 7.8  ALBUMIN 3.9   CBG: No results for input(s): GLUCAP in the last 168 hours.  Discharge time spent: greater than 40 minutes.  Signed: Adriana DELENA Grams, MD [facility] 03/16/2024

## 2024-03-16 NOTE — Plan of Care (Signed)
 Pt received, Educated on, and understands AVS discharge instructions.  Pt has all belongings and medications.  IV accesses discontinued

## 2024-03-16 NOTE — Discharge Instructions (Addendum)
 Advance diet slowly; MD recommendation soft diet for next 2-3 days

## 2024-03-18 ENCOUNTER — Other Ambulatory Visit (HOSPITAL_COMMUNITY): Payer: Self-pay

## 2024-03-26 ENCOUNTER — Other Ambulatory Visit (HOSPITAL_COMMUNITY): Payer: Self-pay

## 2024-03-26 DIAGNOSIS — K921 Melena: Secondary | ICD-10-CM | POA: Diagnosis not present

## 2024-03-26 DIAGNOSIS — K529 Noninfective gastroenteritis and colitis, unspecified: Secondary | ICD-10-CM | POA: Diagnosis not present

## 2024-03-26 DIAGNOSIS — F419 Anxiety disorder, unspecified: Secondary | ICD-10-CM | POA: Diagnosis not present

## 2024-03-26 DIAGNOSIS — E785 Hyperlipidemia, unspecified: Secondary | ICD-10-CM | POA: Diagnosis not present

## 2024-03-26 DIAGNOSIS — R030 Elevated blood-pressure reading, without diagnosis of hypertension: Secondary | ICD-10-CM | POA: Diagnosis not present

## 2024-03-26 DIAGNOSIS — M810 Age-related osteoporosis without current pathological fracture: Secondary | ICD-10-CM | POA: Diagnosis not present

## 2024-04-03 DIAGNOSIS — K625 Hemorrhage of anus and rectum: Secondary | ICD-10-CM | POA: Diagnosis not present

## 2024-04-03 DIAGNOSIS — K5790 Diverticulosis of intestine, part unspecified, without perforation or abscess without bleeding: Secondary | ICD-10-CM | POA: Diagnosis not present

## 2024-04-03 DIAGNOSIS — K529 Noninfective gastroenteritis and colitis, unspecified: Secondary | ICD-10-CM | POA: Diagnosis not present

## 2024-05-08 ENCOUNTER — Encounter: Payer: Self-pay | Admitting: Podiatry

## 2024-05-08 ENCOUNTER — Ambulatory Visit (INDEPENDENT_AMBULATORY_CARE_PROVIDER_SITE_OTHER): Admitting: Podiatry

## 2024-05-08 DIAGNOSIS — B351 Tinea unguium: Secondary | ICD-10-CM | POA: Diagnosis not present

## 2024-05-08 DIAGNOSIS — M79672 Pain in left foot: Secondary | ICD-10-CM

## 2024-05-08 DIAGNOSIS — M79671 Pain in right foot: Secondary | ICD-10-CM

## 2024-05-08 NOTE — Progress Notes (Signed)
 Patient presents for evaluation and treatment of tenderness and some redness around nails feet.  Tenderness around toes with walking and wearing shoes.  Physical exam:  General appearance: Alert, pleasant, and in no acute distress.  Vascular: Pedal pulses: DP 2/4 B/L, PT 0/4 B/L. Mild edema lower legs bilaterally  Neurological:    Dermatologic:  Nails thickened, disfigured, discolored 1-5 BL with subungual debris.  Redness and hypertrophic nail folds along nail folds bilaterally but no signs of drainage or infection.  Musculoskeletal:     Diagnosis: 1. Painful onychomycotic nails 1 through 5 bilaterally. 2. Pain toes 1 through 5 bilaterally.  Plan: -Debrided onychomycotic nails 1 through 5 bilaterally.  Verbally debrided nails with nail clipper and reduced with a power bur.  Return 3 months RFC

## 2024-05-15 DIAGNOSIS — R1084 Generalized abdominal pain: Secondary | ICD-10-CM | POA: Diagnosis not present

## 2024-05-15 DIAGNOSIS — K5790 Diverticulosis of intestine, part unspecified, without perforation or abscess without bleeding: Secondary | ICD-10-CM | POA: Diagnosis not present

## 2024-05-28 DIAGNOSIS — W01198A Fall on same level from slipping, tripping and stumbling with subsequent striking against other object, initial encounter: Secondary | ICD-10-CM | POA: Diagnosis not present

## 2024-05-28 DIAGNOSIS — I493 Ventricular premature depolarization: Secondary | ICD-10-CM | POA: Diagnosis not present

## 2024-05-28 DIAGNOSIS — R0781 Pleurodynia: Secondary | ICD-10-CM | POA: Diagnosis not present

## 2024-05-28 DIAGNOSIS — M25572 Pain in left ankle and joints of left foot: Secondary | ICD-10-CM | POA: Diagnosis not present

## 2024-05-28 DIAGNOSIS — S82892A Other fracture of left lower leg, initial encounter for closed fracture: Secondary | ICD-10-CM | POA: Diagnosis not present

## 2024-05-28 DIAGNOSIS — S8265XA Nondisplaced fracture of lateral malleolus of left fibula, initial encounter for closed fracture: Secondary | ICD-10-CM | POA: Diagnosis not present

## 2024-05-28 NOTE — Progress Notes (Signed)
 Subjective Patient ID: Erica Orr is a 89 y.o. female.  Chief Complaint  Patient presents with  . Hip Pain    Fell Sunday afternoon. Hit coffee table when she fell. Says her ribs, hip and ankle hurt on her left side. Yesterday she thought it was better but today is worse. Hard to get up and walk around. Bruising and swelling of L ankle. Tylenol , 0800 today was last dose.   . Ankle Pain    The following information was reviewed by members of the visit team:  Tobacco  Allergies  Meds  Problems  Med Hx  Surg Hx  OB Status   Fam Hx      Pt is a 89 here for left ankle pain, rib pain, fell Sunday afternoon, states hit lower ribs on coffee table, thought was improving yesterday today pain worse; states ankle more swollen, bruising present. No CP/SOB, neurovascular status and ROM of ankle intact, does have some pain with dorsiflexion. Denies head injury, LOC or other injuries, stable and in NAD    Review of Systems  Musculoskeletal:  Positive for arthralgias and myalgias.  All other systems reviewed and are negative.   Objective Physical Exam Vitals and nursing note reviewed.  Constitutional:      General: She is not in acute distress.    Appearance: She is normal weight.  HENT:     Head: Normocephalic.     Nose: Nose normal.     Mouth/Throat:     Mouth: Mucous membranes are moist.  Eyes:     Extraocular Movements: Extraocular movements intact.     Pupils: Pupils are equal, round, and reactive to light.  Cardiovascular:     Rate and Rhythm: Normal rate. Rhythm irregular.  Pulmonary:     Effort: Pulmonary effort is normal.     Breath sounds: Normal breath sounds.  Musculoskeletal:        General: Normal range of motion.     Cervical back: Normal range of motion.  Skin:    General: Skin is warm.     Capillary Refill: Capillary refill takes less than 2 seconds.  Neurological:     General: No focal deficit present.     Mental Status: She is alert and oriented to  person, place, and time.  Psychiatric:        Mood and Affect: Mood normal.        Behavior: Behavior normal.     Assessment/Plan Diagnoses and all orders for this visit:  Fracture of left malleolus, closed, initial encounter -     XR Foot Minimum 3 Views Left -     Walking boot -     Amb DME WALKING BOOT -     Ambulatory Referral to Orthopedics; Future -     walker misc; Use as directed.  Acute left ankle pain -     XR Ankle Minimum 3 Views Left -     Ambulatory Referral to Orthopedics; Future  Rib pain on left side -     XR Ribs 2 Views Left With Chest Anteroposterior  Frequent PVCs -     ECG 12 lead -     CBC with Differential -     POCT Metabolic Panel, Comprehensive -     TSH -     Ambulatory referral to Cardiology; Future  Other orders -     cranberry-vit C-mannose-herb 1 gram-120 mg- 1 gram/30 mL liqd; Take 2 tablets by mouth daily with breakfast.  DDX: tendonitis, osteoarthritis, fracture, dislocation, sprain, strain   MDM: Pt here for left ankle pain, rib pain, xray of ankle showing traverse fracture of malleolus - pt placed in boot, has crutches at home. Also wrote pt for a walker for ambulation, advised [facility] f/u as she has been there prior; rib xr with no acute fractures.   On exam noticed irregular heart rate which pt states has no hx of , does report she saw PCP for yearly physical and he mentioned then she sounded irregular - per pt did EKG and they said was ok; unable to view in chart. EKG completed here showing bigeminy; she is asymptomatic, discussed with Cards who recc [facility] f/u and possibly holter - I did check labs to ensure no electrolyte abnormalities, normal - CBC/TSH pending; I called Cards [place] at [facility] to see if they could see her as she does not want to travel to [place] - they state with referral could see her in next week, referral sent. Pt given all information regarding referrals and given instructions for red flags warranting ER  evaluation. Also advised on pain management and f/u with PCP. Pt verbalized understanding, agreeable to treatment plan, stable at departure.   Electronically signed: Rexene Charlies Pouch, NP 05/28/2024  12:33 PM

## 2024-06-04 NOTE — Telephone Encounter (Signed)
 Completed Video Verification: Updated Email  Password Reset

## 2024-06-11 ENCOUNTER — Other Ambulatory Visit (HOSPITAL_BASED_OUTPATIENT_CLINIC_OR_DEPARTMENT_OTHER): Payer: Self-pay

## 2024-06-11 ENCOUNTER — Encounter (HOSPITAL_BASED_OUTPATIENT_CLINIC_OR_DEPARTMENT_OTHER): Payer: Self-pay | Admitting: Family

## 2024-06-11 ENCOUNTER — Ambulatory Visit (INDEPENDENT_AMBULATORY_CARE_PROVIDER_SITE_OTHER): Admitting: Family

## 2024-06-11 VITALS — BP 140/80 | HR 86 | Ht 60.0 in | Wt 130.0 lb

## 2024-06-11 DIAGNOSIS — I10 Essential (primary) hypertension: Secondary | ICD-10-CM | POA: Diagnosis not present

## 2024-06-11 DIAGNOSIS — Z23 Encounter for immunization: Secondary | ICD-10-CM | POA: Diagnosis not present

## 2024-06-11 DIAGNOSIS — I493 Ventricular premature depolarization: Secondary | ICD-10-CM | POA: Diagnosis not present

## 2024-06-11 MED ORDER — FLUZONE HIGH-DOSE 0.5 ML IM SUSY
0.5000 mL | PREFILLED_SYRINGE | Freq: Once | INTRAMUSCULAR | 0 refills | Status: AC
  Filled 2024-06-11: qty 0.5, 1d supply, fill #0

## 2024-06-11 NOTE — Progress Notes (Signed)
  Cardiology Office Note   Date:  06/11/2024  ID:  [name], [name] 1929-11-15, MRN [id number] PCP: Erica Toribio MATSU, MD  [facility] Providers Cardiologist:  None     History of Present Illness Erica Orr is a 89 y.o. female with history of hypertension, PVC, diverticulosis  Admitted 7/2 - 03/16/2024 after presenting with rectal bleeding.  Evaluated by GI with no recommendation for intervention such as EGD.  She was given IV Protonix  which was transition to p.o.  Hemoglobin remained stable.  CT abdomen pelvis with moderate length segment of wall thickening of the transverse and descending colon consistent with colitis.  Treated with antibiotics, diet slowly advanced.  She was discharged on amlodipine  5 mg daily.  She was seen at [facility] 05/19/2024 for left ankle pain, wrist pain with x-ray of the ankle showing traverse fracture of malleus after fall.  On exam provider noted a regular rate with EKG with PVC and pattern of bigeminy.  Presents today to establish care with her husband. L ankle remains in boot, seeing orthopedics next week. Reports pain in left ribs, has been trying to take it easy. Reports she monitor BP at home one week prior to her annual physical with upper arm cuff with readings <130/80. Has been diagnosed with white coat hypertension, no longer taking Amlodipine . Reports poor reaction to IV BP medication in the hospital, likely Hydralazine  on review. Reports no shortness of breath nor dyspnea on exertion. Reports no chest pain, pressure, or tightness. No edema, orthopnea, PND. Reports no palpitations.    ROS: Please see the history of present illness.    All other systems reviewed and are negative.   Studies Reviewed          Risk Assessment/Calculations          Physical Exam VS:  BP (!) 140/80 (BP Location: Left Arm, Patient Position: Sitting, Cuff Size: Normal)   Pulse 86   Ht 5' (1.524 m)   Wt 130 lb (59 kg)   BMI 25.39 kg/m        Wt  Readings from Last 3 Encounters:  06/11/24 130 lb (59 kg)  03/13/24 132 lb (59.9 kg)    GEN: Well nourished, well developed in no acute distress NECK: No JVD; No carotid bruits CARDIAC: IRIR, no murmurs, rubs, gallops RESPIRATORY:  Clear to auscultation without rales, wheezing or rhonchi  ABDOMEN: Soft, non-tender, non-distended EXTREMITIES:  No edema; No deformity   ASSESSMENT AND PLAN  PVC - irregular on exam. Recent ED visit with PVC in pattern of bigeminy.  Asymptomatic with no palpitations, dyspnea, lightheadedness. Plan for 7 day ZIO. If PVC burden <15%, no intervention. If PVC burden >15%, consider echo and beta blocker. Encouraged to continue to hydrate well, limit caffeine.  White coat hypertension - Reports BP controlled at home. No indication for antihypertensive therapy. Discussed to monitor BP at home at least 2 hours after medications and sitting for 5-10 minutes.        Dispo: follow up pending ZIO results  Signed, Erica GORMAN Finder, NP

## 2024-06-11 NOTE — Patient Instructions (Signed)
 Medication Instructions:   Your physician recommends that you continue on your current medications as directed. Please refer to the Current Medication list given to you today.   *If you need a refill on your cardiac medications before your next appointment, please call your pharmacy*  Lab Work:  None ordered.  If you have labs (blood work) drawn today and your tests are completely normal, you will receive your results only by: MyChart Message (if you have MyChart) OR A paper copy in the mail If you have any lab test that is abnormal or we need to change your treatment, we will call you to review the results.  Testing/Procedures:  GEOFFRY HEWS- Long Term Monitor Instructions  Your physician has requested you wear a ZIO patch monitor for 7 days.  This is a single patch monitor. [employer] supplies one patch monitor per enrollment. Additional stickers are not available. Please do not apply patch if you will be having a Nuclear Stress Test,  Echocardiogram, Cardiac CT, MRI, or Chest Xray during the period you would be wearing the  monitor. The patch cannot be worn during these tests. You cannot remove and re-apply the  ZIO XT patch monitor.  Your ZIO patch monitor will be mailed 3 day USPS to your address on file. It may take 3-5 days  to receive your monitor after you have been enrolled.  Once you have received your monitor, please review the enclosed instructions. Your monitor  has already been registered assigning a specific monitor serial # to you.  Billing and Patient Assistance Program Information  We have supplied [employer] with any of your insurance information on file for billing purposes. [employer] offers a sliding scale Patient Assistance Program for patients that do not have  insurance, or whose insurance does not completely cover the cost of the ZIO monitor.  You must apply for the Patient Assistance Program to qualify for this discounted rate.  To apply, please call [employer] at  [phone], select option 4, select option 2, ask to apply for  Patient Assistance Program. Meredeth will ask your household income, and how many people  are in your household. They will quote your out-of-pocket cost based on that information.  [employer] will also be able to set up a 57-month, interest-free payment plan if needed.  Applying the monitor   Shave hair from upper left chest.  Hold abrader disc by orange tab. Rub abrader in 40 strokes over the upper left chest as  indicated in your monitor instructions.  Clean area with 4 enclosed alcohol  pads. Let dry.  Apply patch as indicated in monitor instructions. Patch will be placed under collarbone on left  side of chest with arrow pointing upward.  Rub patch adhesive wings for 2 minutes. Remove white label marked 1. Remove the white  label marked 2. Rub patch adhesive wings for 2 additional minutes.  While looking in a mirror, press and release button in center of patch. A small green light will  flash 3-4 times. This will be your only indicator that the monitor has been turned on.  Do not shower for the first 24 hours. You may shower after the first 24 hours.  Press the button if you feel a symptom. You will hear a small click. Record Date, Time and  Symptom in the Patient Logbook.  When you are ready to remove the patch, follow instructions on the last 2 pages of Patient  Logbook. Stick patch monitor onto the last page of Patient Logbook.  Place Patient Logbook in the blue and white box. Use locking tab on box and tape box closed  securely. The blue and white box has prepaid postage on it. Please place it in the mailbox as  soon as possible. Your physician should have your test results approximately 7 days after the  monitor has been mailed back to [facility].  Call [facility] Customer Care at [phone] if you have questions regarding  your ZIO XT patch monitor. Call them immediately if you see an orange light  blinking on your  monitor.  If your monitor falls off in less than 4 days, contact our Monitor department at [phone].  If your monitor becomes loose or falls off after 4 days call [employer] at [phone] for  suggestions on securing your monitor   Follow-Up: At [facility], you and your health needs are our priority.  As part of our continuing mission to provide you with exceptional heart care, our providers are all part of one team.  This team includes your primary Cardiologist (physician) and Advanced Practice Providers or APPs (Physician Assistants and Nurse Practitioners) who all work together to provide you with the care you need, when you need it.  Your next appointment:   As needed   Provider:       We recommend signing up for the patient portal called MyChart.  Sign up information is provided on this After Visit Summary.  MyChart is used to connect with patients for Virtual Visits (Telemedicine).  Patients are able to view lab/test results, encounter notes, upcoming appointments, etc.  Non-urgent messages can be sent to your provider as well.   To learn more about what you can do with MyChart, go to [email or web address].

## 2024-06-20 DIAGNOSIS — M79672 Pain in left foot: Secondary | ICD-10-CM | POA: Diagnosis not present

## 2024-06-20 DIAGNOSIS — S8265XA Nondisplaced fracture of lateral malleolus of left fibula, initial encounter for closed fracture: Secondary | ICD-10-CM | POA: Diagnosis not present

## 2024-06-20 DIAGNOSIS — M25572 Pain in left ankle and joints of left foot: Secondary | ICD-10-CM | POA: Diagnosis not present

## 2024-06-28 ENCOUNTER — Ambulatory Visit: Attending: Family

## 2024-06-28 DIAGNOSIS — I10 Essential (primary) hypertension: Secondary | ICD-10-CM

## 2024-06-28 DIAGNOSIS — I493 Ventricular premature depolarization: Secondary | ICD-10-CM

## 2024-06-28 NOTE — Progress Notes (Unsigned)
 Enrolled patient for a 7 day Zio XT monitor to be mailed to patients home.

## 2024-07-15 DIAGNOSIS — I493 Ventricular premature depolarization: Secondary | ICD-10-CM | POA: Diagnosis not present

## 2024-07-15 DIAGNOSIS — I10 Essential (primary) hypertension: Secondary | ICD-10-CM | POA: Diagnosis not present

## 2024-07-17 DIAGNOSIS — M722 Plantar fascial fibromatosis: Secondary | ICD-10-CM | POA: Diagnosis not present

## 2024-07-17 DIAGNOSIS — S8265XA Nondisplaced fracture of lateral malleolus of left fibula, initial encounter for closed fracture: Secondary | ICD-10-CM | POA: Diagnosis not present

## 2024-07-19 ENCOUNTER — Ambulatory Visit (HOSPITAL_BASED_OUTPATIENT_CLINIC_OR_DEPARTMENT_OTHER): Payer: Self-pay | Admitting: Family

## 2024-07-19 DIAGNOSIS — R9431 Abnormal electrocardiogram [ECG] [EKG]: Secondary | ICD-10-CM

## 2024-07-19 DIAGNOSIS — I493 Ventricular premature depolarization: Secondary | ICD-10-CM

## 2024-07-19 DIAGNOSIS — I10 Essential (primary) hypertension: Secondary | ICD-10-CM | POA: Diagnosis not present

## 2024-07-19 MED ORDER — METOPROLOL SUCCINATE ER 25 MG PO TB24: 25.0000 mg | ORAL_TABLET | Freq: Every day | ORAL | 1 refills | Status: AC

## 2024-07-19 NOTE — Telephone Encounter (Signed)
-----   Message from Reche GORMAN Finder sent at 07/19/2024  4:15 PM EST ----- Monitor with predominantly normal sinus rhythm.  66 episodes of SVT which is a fast heartbeat in the top chamber of the heart lasting up to 15 seconds.  Early beats in the bottom chambers of the  heart called PVCs occurred 20% of the time.  This is quite frequent and can damage heart muscle.    Recommend start Toprol 25 mg daily to help prevent PVCs from happening as often.    Recommend echocardiogram.    Recommend referral to electrophysiology for further management of PVC. ----- Message ----- From: Jeffrie Oneil BROCKS, MD Sent: 07/19/2024   3:18 PM EST To: Reche GORMAN Finder, NP

## 2024-07-19 NOTE — Telephone Encounter (Signed)
 The patient has been notified of the result and verbalized understanding.  All questions (if any) were answered.  Pt aware we will start her on Toprol XL 25 mg po daily to help prevent PVCs.  Pt also aware we will also order for her to get an echo done as well as refer to EP for further management.   Pt aware she will get a call back sometime next week to arrange her echo and EP appt.   Confirmed the pharmacy of choice with the pt.   Pt verbalized understanding and agrees with this plan.

## 2024-07-22 ENCOUNTER — Telehealth (HOSPITAL_BASED_OUTPATIENT_CLINIC_OR_DEPARTMENT_OTHER): Payer: Self-pay | Admitting: Family

## 2024-07-22 NOTE — Telephone Encounter (Signed)
 Pt was calling to let Reche Finder, NP know that she is not going to start taking prescribed Toprol XL 25 mg po daily until she gets back in town on next Tuesday.  She is hesitant to start a new medication while away from home, for she reports she is sensitive to all new meds at first.  She has picked the prescription up and will take it with her just incase, but is planning on not starting this until she returns home next Tuesday.   Informed the pt that would be fine and advised her to take the script with her, just incase.   She asked that I share this plan with Reche Finder, NP.   Will forward this message to Caitlin as an FYI.

## 2024-07-22 NOTE — Telephone Encounter (Signed)
Agree with plan as provided by nursing team .  Alver Sorrow, NP

## 2024-07-22 NOTE — Telephone Encounter (Signed)
 Pt just picked up metoprolol 25 mg from pahrmacy . She is going on a trip this weekend, due to her having issues with all medications, she asked If it would be ok to start after the trip. Please call and advise.

## 2024-08-01 ENCOUNTER — Ambulatory Visit (INDEPENDENT_AMBULATORY_CARE_PROVIDER_SITE_OTHER): Admitting: Podiatry

## 2024-08-01 DIAGNOSIS — L600 Ingrowing nail: Secondary | ICD-10-CM | POA: Diagnosis not present

## 2024-08-01 MED ORDER — CEPHALEXIN 500 MG PO CAPS: 1000.0000 mg | ORAL_CAPSULE | Freq: Two times a day (BID) | ORAL | 0 refills | Status: AC

## 2024-08-01 NOTE — Patient Instructions (Signed)

## 2024-08-01 NOTE — Progress Notes (Signed)
 Patient complains of painful ingrown lateral hallux left. Patient denies fevers, chills, nausea, vomiting.  Nail borders been red and swollen.  Tender  Objective:  Vitals: Reviewed  General: Well developed, nourished, in no acute distress, alert and oriented x3   Vascular: DP pulse 2/4 bilateral. PT pulse 2/4 bilateral.  Moderate edema toe with ingrown nail.  Capillary refill time immediate bilaterally  Dermatology: Erythema, edema, incurvated nail border lateral border hallux left with serous drainage . Tenderness present with palpation. Normal skin tone and texture feet with normal hair growth.  Neurological: Grossly intact. Normal reflexes.   Musculoskeletal: Tenderness with palpation of the distal hallux left. No tenderness or painful ROM at IPJ.  Diagnosis: Ingrown nail lateral border hallux left  Plan: -discussed etiology and treatment of ingrown nails. Discussed surgical vs conservative treatment. -Consent signed for appropriate matrixectomy affected nail(s). -Rx: Keflex 500 mg, 2 p.o. twice daily for 7 days  Procedure(s):   - Matrixectomy(s) border hallux left: Toe anesthetized with 3cc 2:1 mixture 2% Lidocaine with epinephrine: Sodium Bicarbonate. Surgical site prepped. Digital tourniquet applied.  Avulsion of nail border. performed. Matrixecomy performed with three 30 second applications of phenol to nail matrix. Site irrigated with alcohol .  Tourniquet released with good vascularity noticed in digit.  Applied triple antibiotic to nailbed and applied gauze and Coban dressing. - Written and oral postoperative instructions given.  -Return for post-op 2 weeks.  JINNY Prentice Binder, DPM

## 2024-08-14 ENCOUNTER — Ambulatory Visit: Admitting: Podiatry

## 2024-08-14 DIAGNOSIS — M79671 Pain in right foot: Secondary | ICD-10-CM | POA: Diagnosis not present

## 2024-08-15 ENCOUNTER — Ambulatory Visit: Admitting: Cardiology

## 2024-08-16 ENCOUNTER — Encounter: Payer: Self-pay | Admitting: Podiatry

## 2024-08-16 ENCOUNTER — Ambulatory Visit: Admitting: Podiatry

## 2024-08-16 DIAGNOSIS — B351 Tinea unguium: Secondary | ICD-10-CM

## 2024-08-16 DIAGNOSIS — M79671 Pain in right foot: Secondary | ICD-10-CM

## 2024-08-16 DIAGNOSIS — M79672 Pain in left foot: Secondary | ICD-10-CM | POA: Diagnosis not present

## 2024-08-16 NOTE — Progress Notes (Signed)
 Patient presents for evaluation and treatment of tenderness and some redness around nails feet.  Tenderness around toes with walking and wearing shoes.  Physical exam:  General appearance: Alert, pleasant, and in no acute distress.  Vascular: Pedal pulses: DP 2/4 B/L, PT 2/4 B/L. Moderate edema lower legs bilaterally.  Capillary refill time immediate bilaterally  Neurologic:  Dermatologic:  Nails thickened, disfigured, discolored 1-5 BL with subungual debris.  Redness and hypertrophic nail folds along nail folds bilaterally but no signs of drainage or infection.  Surgery site healing well with no signs of infection hallux left.  Some healed blistering with some inflammatory reaction probably from the phenol.  Musculoskeletal:     Diagnosis: 1. Painful onychomycotic nails 1 through 5 bilaterally. 2. Pain toes 1 through 5 bilaterally.  Plan: -Debrided onychomycotic nails 1 through 5 bilaterally.  Sharply debrided nails with nail clipper and reduced with a power bur. -Recommend to continue soaking the surgical toe hallux left and apply a mixture of antibiotic ointment and over-the-counter hydrocortisone cream 2 to 3 weeks.  Should be healed by then or if she notices any signs of infection call for appointment.    Return 3 months RFC

## 2024-08-19 ENCOUNTER — Other Ambulatory Visit (INDEPENDENT_AMBULATORY_CARE_PROVIDER_SITE_OTHER)

## 2024-08-19 DIAGNOSIS — R9431 Abnormal electrocardiogram [ECG] [EKG]: Secondary | ICD-10-CM | POA: Diagnosis not present

## 2024-08-19 DIAGNOSIS — I493 Ventricular premature depolarization: Secondary | ICD-10-CM

## 2024-08-19 LAB — ECHOCARDIOGRAM COMPLETE
Area-P 1/2: 4.21 cm2
S' Lateral: 2.07 cm

## 2024-08-20 ENCOUNTER — Ambulatory Visit (HOSPITAL_BASED_OUTPATIENT_CLINIC_OR_DEPARTMENT_OTHER): Payer: Self-pay | Admitting: Family

## 2024-08-22 DIAGNOSIS — M79671 Pain in right foot: Secondary | ICD-10-CM | POA: Diagnosis not present

## 2024-09-02 ENCOUNTER — Ambulatory Visit: Attending: Cardiovascular Disease | Admitting: Cardiovascular Disease

## 2024-09-02 ENCOUNTER — Encounter: Payer: Self-pay | Admitting: Cardiovascular Disease

## 2024-09-02 VITALS — BP 140/84 | HR 75 | Ht 60.0 in | Wt 128.0 lb

## 2024-09-02 DIAGNOSIS — R9431 Abnormal electrocardiogram [ECG] [EKG]: Secondary | ICD-10-CM | POA: Diagnosis not present

## 2024-09-02 DIAGNOSIS — I493 Ventricular premature depolarization: Secondary | ICD-10-CM | POA: Diagnosis not present

## 2024-09-02 NOTE — Patient Instructions (Addendum)
 Medication Instructions:  Your physician has recommended you make the following change in your medication:  1) START taking magnesium taurate supplement  *If you need a refill on your cardiac medications before your next appointment, please call your pharmacy*  Follow-Up: At [facility], you and your health needs are our priority.  As part of our continuing mission to provide you with exceptional heart care, our providers are all part of one team.  This team includes your primary Cardiologist (physician) and Advanced Practice Providers or APPs (Physician Assistants and Nurse Practitioners) who all work together to provide you with the care you need, when you need it.  Your next appointment:   As needed with Dr. Mealor

## 2024-09-02 NOTE — Progress Notes (Signed)
" °  Electrophysiology Office Note:    Date:  09/02/2024   ID:  Erica Orr, DOB [birth year], MRN [id number]  PCP:  Yolande Toribio MATSU, MD   [facility] Providers Cardiologist:  None Electrophysiologist:  Eulas FORBES Furbish, MD     Referring MD: Vannie Reche RAMAN, NP   History of Present Illness:    Erica Orr is a 89 y.o. female with a medical history significant for PVCs, hypertension, referred for arrhythmia management.      Discussed the use of AI scribe software for clinical note transcription with the patient, who gave verbal consent to proceed.  History of Present Illness  She has a history of frequent PVCs and has been seen in the general cardiology clinic.  She reports that these were first noted when she had a broken ankle.  She has no palpitations, no lightheadedness, no dizziness, no fatigue.  She wore a monitor that showed a 20% PVC burden.  PVCs are monomorphic and fairly narrow, strongly positive in inferior leads.         Today, she reports she is well and has no complaints  EKGs/Labs/Other Studies Reviewed Today:     Echocardiogram:  TTE August 19, 2024 LVEF 50 to 55%.   Monitors:  6 day monitor July 08, 2024-- my interpretation Heart rate 57 to 136 bpm, average 79 bpm 20% PVC burden.  No symptoms reported    EKG:   EKG Interpretation Date/Time:  Monday September 02 2024 09:01:11 EST Ventricular Rate:  75 PR Interval:  194 QRS Duration:  78 QT Interval:  406 QTC Calculation: 453 R Axis:   67  Text Interpretation: Sinus rhythm with Premature supraventricular complexes and with occasional Premature ventricular complexes Cannot rule out Anterior infarct (cited on or before 02-Sep-2024) When compared with ECG of 02-Sep-2024 09:00, Premature supraventricular complexes are now Present Confirmed by Furbish Eulas [phone]) on 09/02/2024 9:18:18 AM     Physical Exam:    VS:  BP (!) 140/84 (BP Location: Left Arm, Patient  Position: Sitting, Cuff Size: Normal)   Pulse 75   Ht 5' (1.524 m)   Wt 128 lb (58.1 kg)   SpO2 97%   BMI 25.00 kg/m     Wt Readings from Last 3 Encounters:  09/02/24 128 lb (58.1 kg)  06/11/24 130 lb (59 kg)  03/13/24 132 lb (59.9 kg)     GEN: Well nourished, well developed in no acute distress CARDIAC: RRR, no murmurs, rubs, gallops RESPIRATORY:  Normal work of breathing MUSCULOSKELETAL: no edema    ASSESSMENT & PLAN:     Frequent PVCs Asymptomatic, 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

## 2024-12-03 ENCOUNTER — Ambulatory Visit: Admitting: Podiatry
